# Patient Record
Sex: Female | Born: 1970 | Race: White | Hispanic: No | Marital: Married | Smoking: Former smoker
Health system: Southern US, Community
[De-identification: ages and names within clinical notes are randomized; demographics above are authoritative.]

## PROBLEM LIST (undated history)

## (undated) DIAGNOSIS — F4310 Post-traumatic stress disorder, unspecified: Secondary | ICD-10-CM

## (undated) DIAGNOSIS — F909 Attention-deficit hyperactivity disorder, unspecified type: Secondary | ICD-10-CM

## (undated) DIAGNOSIS — R1084 Generalized abdominal pain: Secondary | ICD-10-CM

## (undated) DIAGNOSIS — K219 Gastro-esophageal reflux disease without esophagitis: Secondary | ICD-10-CM

## (undated) DIAGNOSIS — F419 Anxiety disorder, unspecified: Secondary | ICD-10-CM

## (undated) DIAGNOSIS — G8929 Other chronic pain: Secondary | ICD-10-CM

## (undated) HISTORY — PX: BREAST SURGERY: SHX581

---

## 2008-06-26 ENCOUNTER — Emergency Department (HOSPITAL_BASED_OUTPATIENT_CLINIC_OR_DEPARTMENT_OTHER): Admission: EM | Admit: 2008-06-26 | Discharge: 2008-06-26 | Payer: Self-pay | Admitting: Emergency Medicine

## 2010-07-30 LAB — BASIC METABOLIC PANEL
Calcium: 10.2 mg/dL (ref 8.4–10.5)
GFR calc Af Amer: >60 mL/min (ref >60)
GFR calc non Af Amer: >60 mL/min (ref >60)
Sodium: 145 mEq/L (ref 135–145)

## 2011-05-02 ENCOUNTER — Encounter (HOSPITAL_BASED_OUTPATIENT_CLINIC_OR_DEPARTMENT_OTHER): Payer: Self-pay | Admitting: Emergency Medicine

## 2011-05-02 ENCOUNTER — Emergency Department (HOSPITAL_BASED_OUTPATIENT_CLINIC_OR_DEPARTMENT_OTHER)
Admission: EM | Admit: 2011-05-02 | Discharge: 2011-05-02 | Disposition: A | Discharge status: Home / Self Care | Payer: BC Managed Care – PPO | Attending: Emergency Medicine | Admitting: Emergency Medicine

## 2011-05-02 DIAGNOSIS — S0100XA Unspecified open wound of scalp, initial encounter: Secondary | ICD-10-CM | POA: Insufficient documentation

## 2011-05-02 DIAGNOSIS — IMO0002 Reserved for concepts with insufficient information to code with codable children: Secondary | ICD-10-CM | POA: Insufficient documentation

## 2011-05-02 DIAGNOSIS — S0101XA Laceration without foreign body of scalp, initial encounter: Secondary | ICD-10-CM

## 2011-05-02 DIAGNOSIS — Z79899 Other long term (current) drug therapy: Secondary | ICD-10-CM | POA: Insufficient documentation

## 2011-05-02 MED ORDER — HYDROCODONE-ACETAMINOPHEN 5-325 MG PO TABS
1.0000 | ORAL_TABLET | Freq: Once | ORAL | Status: AC
  Administered 2011-05-02: 1 via ORAL
  Filled 2011-05-02: qty 1

## 2011-05-02 NOTE — ED Notes (Signed)
Pt closing hatch to car and accidentally pulled down on head.  Denies LOC.  Pt has laceration to top of head.  Bleeding controlled.

## 2011-05-02 NOTE — ED Notes (Signed)
D/c home with ride- ice pack given for home use 

## 2011-05-02 NOTE — ED Provider Notes (Signed)
Medical screening examination/treatment/procedure(s) were performed by non-physician practitioner and as supervising physician I was immediately available for consultation/collaboration.   Dava Rensch A Christabella Alvira, MD 05/02/11 2212 

## 2011-05-02 NOTE — ED Provider Notes (Signed)
History     CSN: [id number]  Arrival date & time 05/02/11  7829   First MD Initiated Contact with Patient 05/02/11 1850      Chief Complaint  Patient presents with  . Head Injury  . Head Laceration    (Consider location/radiation/quality/duration/timing/severity/associated sxs/prior treatment) HPI Comments: Pt was closing the hatch of car and she closed it on her head  Patient is a 41 y.o. female presenting with head injury. The history is provided by the patient. No language interpreter was used.  Head Injury  The incident occurred less than 1 hour ago. She came to the ER via walk-in. The injury mechanism was a direct blow. There was no loss of consciousness. The volume of blood lost was minimal. The quality of the pain is described as throbbing. The pain is moderate. The pain has been constant since the injury.    History reviewed. No pertinent past medical history.  Past Surgical History  Procedure Date  . Cesarean section   . Breast surgery     History reviewed. No pertinent family history.  History  Substance Use Topics  . Smoking status: Current Everyday Smoker -- 1.0 packs/day  . Smokeless tobacco: Not on file  . Alcohol Use: 16.8 oz/week    14 Cans of beer, 14 Glasses of wine per week    OB History    Grav Para Term Preterm Abortions TAB SAB Ect Mult Living                  Review of Systems  All other systems reviewed and are negative.    Allergies  Iodine and Shellfish-derived products  Home Medications   Current Outpatient Rx  Name Route Sig Dispense Refill  . ALPRAZOLAM 1 MG PO TABS Oral Take 0.5 mg by mouth 3 (three) times daily as needed. For anxiety    . GOODY HEADACHE PO Oral Take 1 packet by mouth 2 (two) times daily as needed. For headache    . ATOMOXETINE HCL 18 MG PO CAPS Oral Take 18 mg by mouth daily.    . ATOMOXETINE HCL 25 MG PO CAPS Oral Take 25 mg by mouth daily.    Marland Kitchen METHOCARBAMOL PO Oral Take 1 tablet by mouth at bedtime as  needed. For back pain    . TETRAHYDROZOLINE-ZN SULFATE 0.05-0.25 % OP SOLN Both Eyes Place 1 drop into both eyes every morning.    . TRAZODONE HCL 100 MG PO TABS Oral Take 100 mg by mouth at bedtime.      BP 112/72  Pulse 97  Temp(Src) 98 F (36.7 C) (Oral)  Resp 16  SpO2 98%  LMP 04/19/2011  Physical Exam  Nursing note and vitals reviewed. Constitutional: She is oriented to person, place, and time. She appears well-developed and well-nourished.  HENT:       Pt has a laceration to the top of her scalp  Eyes: Conjunctivae and EOM are normal. Pupils are equal, round, and reactive to light.  Neck: Neck supple.  Cardiovascular: Normal rate and regular rhythm.   Pulmonary/Chest: Effort normal and breath sounds normal.  Musculoskeletal: Normal range of motion.  Neurological: She is alert and oriented to person, place, and time.  Skin:       Laceration to the top of the scalp  Psychiatric: She has a normal mood and affect.    ED Course  LACERATION REPAIR Performed by: Teressa Lower Authorized by: Teressa Lower Consent: Verbal consent obtained. Written consent not obtained. Risks  and benefits: risks, benefits and alternatives were discussed Consent given by: patient Patient understanding: patient states understanding of the procedure being performed Patient identity confirmed: verbally with patient Time out: Immediately prior to procedure a "time out" was called to verify the correct patient, procedure, equipment, support staff and site/side marked as required. Body area: head/neck Location details: scalp Laceration length: 2 cm Foreign bodies: no foreign bodies Anesthesia: local infiltration Local anesthetic: lidocaine 2% with epinephrine Irrigation solution: saline Irrigation method: syringe Amount of cleaning: standard Skin closure: staples Approximation: close Approximation difficulty: simple Comments: 4 staples placed without any problem   (including critical  care time)  Labs Reviewed - No data to display No results found.   1. Scalp laceration       MDM  Wound closed without any problem        Teressa Lower, NP 05/02/11 1913

## 2012-09-23 DIAGNOSIS — D649 Anemia, unspecified: Secondary | ICD-10-CM | POA: Insufficient documentation

## 2012-09-23 DIAGNOSIS — F988 Other specified behavioral and emotional disorders with onset usually occurring in childhood and adolescence: Secondary | ICD-10-CM | POA: Insufficient documentation

## 2013-06-12 DIAGNOSIS — F410 Panic disorder [episodic paroxysmal anxiety] without agoraphobia: Secondary | ICD-10-CM | POA: Insufficient documentation

## 2013-10-10 DIAGNOSIS — Z720 Tobacco use: Secondary | ICD-10-CM | POA: Insufficient documentation

## 2014-01-31 ENCOUNTER — Emergency Department (HOSPITAL_BASED_OUTPATIENT_CLINIC_OR_DEPARTMENT_OTHER): Payer: BC Managed Care – PPO

## 2014-01-31 ENCOUNTER — Encounter (HOSPITAL_BASED_OUTPATIENT_CLINIC_OR_DEPARTMENT_OTHER): Payer: Self-pay | Admitting: Emergency Medicine

## 2014-01-31 ENCOUNTER — Emergency Department (HOSPITAL_BASED_OUTPATIENT_CLINIC_OR_DEPARTMENT_OTHER)
Admission: EM | Admit: 2014-01-31 | Discharge: 2014-01-31 | Disposition: A | Discharge status: Home / Self Care | Payer: BC Managed Care – PPO | Attending: Emergency Medicine | Admitting: Emergency Medicine

## 2014-01-31 DIAGNOSIS — N760 Acute vaginitis: Secondary | ICD-10-CM | POA: Insufficient documentation

## 2014-01-31 DIAGNOSIS — Z79899 Other long term (current) drug therapy: Secondary | ICD-10-CM | POA: Insufficient documentation

## 2014-01-31 DIAGNOSIS — R1013 Epigastric pain: Secondary | ICD-10-CM | POA: Diagnosis not present

## 2014-01-31 DIAGNOSIS — R1030 Lower abdominal pain, unspecified: Secondary | ICD-10-CM

## 2014-01-31 DIAGNOSIS — F4310 Post-traumatic stress disorder, unspecified: Secondary | ICD-10-CM | POA: Diagnosis not present

## 2014-01-31 DIAGNOSIS — G8929 Other chronic pain: Secondary | ICD-10-CM | POA: Diagnosis not present

## 2014-01-31 DIAGNOSIS — M549 Dorsalgia, unspecified: Secondary | ICD-10-CM | POA: Diagnosis not present

## 2014-01-31 DIAGNOSIS — K219 Gastro-esophageal reflux disease without esophagitis: Secondary | ICD-10-CM | POA: Diagnosis not present

## 2014-01-31 DIAGNOSIS — R0602 Shortness of breath: Secondary | ICD-10-CM | POA: Insufficient documentation

## 2014-01-31 DIAGNOSIS — F909 Attention-deficit hyperactivity disorder, unspecified type: Secondary | ICD-10-CM | POA: Diagnosis not present

## 2014-01-31 DIAGNOSIS — B9689 Other specified bacterial agents as the cause of diseases classified elsewhere: Secondary | ICD-10-CM

## 2014-01-31 DIAGNOSIS — Z720 Tobacco use: Secondary | ICD-10-CM | POA: Diagnosis not present

## 2014-01-31 DIAGNOSIS — F419 Anxiety disorder, unspecified: Secondary | ICD-10-CM | POA: Diagnosis not present

## 2014-01-31 HISTORY — DX: Gastro-esophageal reflux disease without esophagitis: K21.9

## 2014-01-31 HISTORY — DX: Generalized abdominal pain: R10.84

## 2014-01-31 HISTORY — DX: Other chronic pain: G89.29

## 2014-01-31 HISTORY — DX: Attention-deficit hyperactivity disorder, unspecified type: F90.9

## 2014-01-31 HISTORY — DX: Anxiety disorder, unspecified: F41.9

## 2014-01-31 HISTORY — DX: Post-traumatic stress disorder, unspecified: F43.10

## 2014-01-31 LAB — CBC
HEMATOCRIT: 45.7 % (ref 36.0–46.0)
Hemoglobin: 15.5 g/dL — ABNORMAL HIGH (ref 12.0–15.0)
MCH: 32.2 pg (ref 26.0–34.0)
MCHC: 33.9 g/dL (ref 30.0–36.0)
MCV: 95 fL (ref 78.0–100.0)
PLATELETS: 361 10*3/uL (ref 150–400)
RBC: 4.81 MIL/uL (ref 3.87–5.11)
RDW: 13.1 % (ref 11.5–15.5)
WBC: 9.9 10*3/uL (ref 4.0–10.5)

## 2014-01-31 LAB — WET PREP, GENITAL
Trich, Wet Prep: NONE SEEN
YEAST WET PREP: NONE SEEN

## 2014-01-31 LAB — COMPREHENSIVE METABOLIC PANEL
ALBUMIN: 4.4 g/dL (ref 3.5–5.2)
ALT: 10 U/L (ref 0–35)
ANION GAP: 12 (ref 5–15)
AST: 18 U/L (ref 0–37)
Alkaline Phosphatase: 63 U/L (ref 39–117)
BILIRUBIN TOTAL: 0.5 mg/dL (ref 0.3–1.2)
BUN: 11 mg/dL (ref 6–23)
CALCIUM: 9.9 mg/dL (ref 8.4–10.5)
CO2: 27 mEq/L (ref 19–32)
CREATININE: 0.8 mg/dL (ref 0.50–1.10)
Chloride: 102 mEq/L (ref 96–112)
GFR calc Af Amer: >90 mL/min (ref >90)
GFR calc non Af Amer: 90 mL/min — ABNORMAL LOW (ref >90)
Glucose, Bld: 90 mg/dL (ref 70–99)
Potassium: 4.3 mEq/L (ref 3.7–5.3)
Sodium: 141 mEq/L (ref 137–147)
TOTAL PROTEIN: 7.4 g/dL (ref 6.0–8.3)

## 2014-01-31 LAB — LIPASE, BLOOD: LIPASE: 33 U/L (ref 11–59)

## 2014-01-31 LAB — TROPONIN I

## 2014-01-31 MED ORDER — MORPHINE SULFATE 4 MG/ML IJ SOLN
4.0000 mg | Freq: Once | INTRAMUSCULAR | Status: AC
  Administered 2014-01-31: 4 mg via INTRAVENOUS
  Filled 2014-01-31: qty 1

## 2014-01-31 MED ORDER — METRONIDAZOLE 500 MG PO TABS: 500.0000 mg | ORAL_TABLET | Freq: Two times a day (BID) | ORAL | Status: DC

## 2014-01-31 MED ORDER — LORAZEPAM 2 MG/ML IJ SOLN
1.0000 mg | Freq: Once | INTRAMUSCULAR | Status: AC
  Administered 2014-01-31: 1 mg via INTRAVENOUS
  Filled 2014-01-31: qty 1

## 2014-01-31 MED ORDER — SODIUM CHLORIDE 0.9 % IV BOLUS (SEPSIS)
1000.0000 mL | Freq: Once | INTRAVENOUS | Status: AC
  Administered 2014-01-31: 1000 mL via INTRAVENOUS

## 2014-01-31 MED ORDER — GI COCKTAIL ~~LOC~~
30.0000 mL | Freq: Once | ORAL | Status: AC
  Administered 2014-01-31: 30 mL via ORAL
  Filled 2014-01-31: qty 30

## 2014-01-31 MED ORDER — SUCRALFATE 1 G PO TABS: 1.0000 g | ORAL_TABLET | Freq: Three times a day (TID) | ORAL | Status: DC

## 2014-01-31 NOTE — ED Notes (Signed)
Patient refusing urine sample, MD made aware.

## 2014-01-31 NOTE — Discharge Instructions (Signed)
Abdominal Pain, Women °Abdominal (stomach, pelvic, or belly) pain can be caused by many things. It is important to tell your doctor: °· The location of the pain. °· Does it come and go or is it present all the time? °· Are there things that start the pain (eating certain foods, exercise)? °· Are there other symptoms associated with the pain (fever, nausea, vomiting, diarrhea)? °All of this is helpful to know when trying to find the cause of the pain. °CAUSES  °· Stomach: virus or bacteria infection, or ulcer. °· Intestine: appendicitis (inflamed appendix), regional ileitis (Crohn's disease), ulcerative colitis (inflamed colon), irritable bowel syndrome, diverticulitis (inflamed diverticulum of the colon), or cancer of the stomach or intestine. °· Gallbladder disease or stones in the gallbladder. °· Kidney disease, kidney stones, or infection. °· Pancreas infection or cancer. °· Fibromyalgia (pain disorder). °· Diseases of the female organs: °¨ Uterus: fibroid (non-cancerous) tumors or infection. °¨ Fallopian tubes: infection or tubal pregnancy. °¨ Ovary: cysts or tumors. °¨ Pelvic adhesions (scar tissue). °¨ Endometriosis (uterus lining tissue growing in the pelvis and on the pelvic organs). °¨ Pelvic congestion syndrome (female organs filling up with blood just before the menstrual period). °¨ Pain with the menstrual period. °¨ Pain with ovulation (producing an egg). °¨ Pain with an IUD (intrauterine device, birth control) in the uterus. °¨ Cancer of the female organs. °· Functional pain (pain not caused by a disease, may improve without treatment). °· Psychological pain. °· Depression. °DIAGNOSIS  °Your doctor will decide the seriousness of your pain by doing an examination. °· Blood tests. °· X-rays. °· Ultrasound. °· CT scan (computed tomography, special type of X-ray). °· MRI (magnetic resonance imaging). °· Cultures, for infection. °· Barium enema (dye inserted in the large intestine, to better view it with  X-rays). °· Colonoscopy (looking in intestine with a lighted tube). °· Laparoscopy (minor surgery, looking in abdomen with a lighted tube). °· Major abdominal exploratory surgery (looking in abdomen with a large incision). °TREATMENT  °The treatment will depend on the cause of the pain.  °· Many cases can be observed and treated at home. °· Over-the-counter medicines recommended by your caregiver. °· Prescription medicine. °· Antibiotics, for infection. °· Birth control pills, for painful periods or for ovulation pain. °· Hormone treatment, for endometriosis. °· Nerve blocking injections. °· Physical therapy. °· Antidepressants. °· Counseling with a psychologist or psychiatrist. °· Minor or major surgery. °HOME CARE INSTRUCTIONS  °· Do not take laxatives, unless directed by your caregiver. °· Take over-the-counter pain medicine only if ordered by your caregiver. Do not take aspirin because it can cause an upset stomach or bleeding. °· Try a clear liquid diet (broth or water) as ordered by your caregiver. Slowly move to a bland diet, as tolerated, if the pain is related to the stomach or intestine. °· Have a thermometer and take your temperature several times a day, and record it. °· Bed rest and sleep, if it helps the pain. °· Avoid sexual intercourse, if it causes pain. °· Avoid stressful situations. °· Keep your follow-up appointments and tests, as your caregiver orders. °· If the pain does not go away with medicine or surgery, you may try: °¨ Acupuncture. °¨ Relaxation exercises (yoga, meditation). °¨ Group therapy. °¨ Counseling. °SEEK MEDICAL CARE IF:  °· You notice certain foods cause stomach pain. °· Your home care treatment is not helping your pain. °· You need stronger pain medicine. °· You want your IUD removed. °· You feel faint or   lightheaded. °· You develop nausea and vomiting. °· You develop a rash. °· You are having side effects or an allergy to your medicine. °SEEK IMMEDIATE MEDICAL CARE IF:  °· Your  pain does not go away or gets worse. °· You have a fever. °· Your pain is felt only in portions of the abdomen. The right side could possibly be appendicitis. The left lower portion of the abdomen could be colitis or diverticulitis. °· You are passing blood in your stools (bright red or black tarry stools, with or without vomiting). °· You have blood in your urine. °· You develop chills, with or without a fever. °· You pass out. °MAKE SURE YOU:  °· Understand these instructions. °· Will watch your condition. °· Will get help right away if you are not doing well or get worse. °Document Released: 01/31/2007 Document Revised: 08/20/2013 Document Reviewed: 02/20/2009 °ExitCare® Patient Information ©2015 [employer]. This information is not intended to replace advice given to you by your health care provider. Make sure you discuss any questions you have with your health care provider. ° °

## 2014-01-31 NOTE — ED Notes (Signed)
Patient transported to CT 

## 2014-01-31 NOTE — ED Provider Notes (Signed)
CSN: [id number]     Arrival date & time 01/31/14  4098 History   First MD Initiated Contact with Patient 01/31/14 0755     Chief Complaint  Patient presents with  . Abdominal Pain  . Back Pain     (Consider location/radiation/quality/duration/timing/severity/associated sxs/prior Treatment) HPI Comments: Patient here with epigastric pain. This is chronic. Takes pantoprazole for this. Has had this over 20 years. Described as squeezing, and today's episode is the worst she's ever had. She also states chronic hx of lower abdominal bloating, stating, "for my size, I always look like I'm 3 months pregnant." Today she is also experiencing sharp lower abdominal pains. No vaginal complaints. No urinary complaints. Is being treated for a yeast infection at this time.  She has never experienced these sharp lower abdominal pains before.   Patient is a 43 y.o. female presenting with abdominal pain and back pain. The history is provided by the patient.  Abdominal Pain Pain location:  Epigastric Pain quality: squeezing   Pain radiates to:  Back Pain severity:  Moderate Onset quality:  Gradual Timing:  Constant Relieved by:  Nothing Worsened by:  Nothing tried Associated symptoms: shortness of breath   Associated symptoms: no chills, no cough, no diarrhea, no fever, no nausea and no vomiting   Back Pain Associated symptoms: abdominal pain   Associated symptoms: no fever     Past Medical History  Diagnosis Date  . GERD (gastroesophageal reflux disease)   . ADHD (attention deficit hyperactivity disorder)   . PTSD (post-traumatic stress disorder)   . Anxiety   . Abdominal pain, chronic, generalized    Past Surgical History  Procedure Laterality Date  . Cesarean section    . Breast surgery     No family history on file. History  Substance Use Topics  . Smoking status: Current Every Day Smoker -- 1.00 packs/day  . Smokeless tobacco: Not on file  . Alcohol Use: 16.8 oz/week    14  Glasses of wine, 14 Cans of beer per week   OB History   Grav Para Term Preterm Abortions TAB SAB Ect Mult Living                 Review of Systems  Constitutional: Negative for fever and chills.  Respiratory: Positive for shortness of breath. Negative for cough.   Gastrointestinal: Positive for abdominal pain. Negative for nausea, vomiting and diarrhea.  Musculoskeletal: Positive for back pain.  All other systems reviewed and are negative.     Allergies  Iodine and Shellfish-derived products  Home Medications   Prior to Admission medications   Medication Sig Start Date End Date Taking? Authorizing Provider  amphetamine-dextroamphetamine (ADDERALL) 10 MG tablet Take 10 mg by mouth daily with breakfast.   Yes Historical Provider, MD  dicyclomine (BENTYL) 20 MG tablet Take 20 mg by mouth every 6 (six) hours.   Yes Historical Provider, MD  lurasidone (LATUDA) 40 MG TABS tablet Take 40 mg by mouth daily with breakfast.   Yes Historical Provider, MD  pantoprazole (PROTONIX) 40 MG tablet Take 40 mg by mouth daily.   Yes Historical Provider, MD  ALPRAZolam Prudy Feeler) 1 MG tablet Take 0.5 mg by mouth 3 (three) times daily as needed. For anxiety    Historical Provider, MD  traZODone (DESYREL) 100 MG tablet Take 100 mg by mouth at bedtime.    Historical Provider, MD   BP 125/91  Pulse 74  Temp(Src) 97.9 F (36.6 C) (Oral)  Resp 16  SpO2  97% Physical Exam  Nursing note and vitals reviewed. Constitutional: She is oriented to person, place, and time. She appears well-developed and well-nourished. No distress.  HENT:  Head: Normocephalic and atraumatic.  Mouth/Throat: Oropharynx is clear and moist.  Eyes: EOM are normal. Pupils are equal, round, and reactive to light.  Neck: Normal range of motion. Neck supple.  Cardiovascular: Normal rate and regular rhythm.  Exam reveals no friction rub.   No murmur heard. Pulmonary/Chest: Effort normal and breath sounds normal. No respiratory  distress. She has no wheezes. She has no rales.  Abdominal: Soft. She exhibits no distension. There is tenderness (lower abdomen, diffuse, epigastric). There is no rebound.  Musculoskeletal: Normal range of motion. She exhibits no edema.  Neurological: She is alert and oriented to person, place, and time. No cranial nerve deficit. She exhibits normal muscle tone. Coordination normal.  Skin: No rash noted. She is not diaphoretic.    ED Course  Procedures (including critical care time) Labs Review Labs Reviewed  CBC  COMPREHENSIVE METABOLIC PANEL  LIPASE, BLOOD  URINALYSIS, ROUTINE W REFLEX MICROSCOPIC  TROPONIN I    Imaging Review Ct Abdomen Pelvis Wo Contrast  01/31/2014   CLINICAL DATA:  Bloating, lower abdominal pain, iodine allergy  EXAM: CT ABDOMEN AND PELVIS WITHOUT CONTRAST  TECHNIQUE: Multidetector CT imaging of the abdomen and pelvis was performed following the standard protocol without IV contrast.  COMPARISON:  08/29/2012.  FINDINGS: Lung bases are unremarkable. Sagittal images of the spine shows degenerative changes lower thoracic spine. Unenhanced liver shows no biliary ductal dilatation. No calcified gallstones are noted within gallbladder.  Unenhanced pancreas, spleen and adrenal glands are unremarkable. Unenhanced kidneys are symmetrical in size. There is nonobstructive punctate calculus in midpole of the left kidney measures 2.4 mm. No hydronephrosis or hydroureter.  No calcified ureteral calculi are noted. No aortic aneurysm. No small bowel obstruction. No pericecal inflammation. The terminal ileum is unremarkable. Normal appendix clearly visualized coronal image 32.  The unenhanced uterus and adnexa are unremarkable. The urinary bladder is unremarkable.  No inguinal adenopathy. No destructive bony lesions are noted within pelvis.  IMPRESSION: 1. Left nonobstructive nephrolithiasis. No hydronephrosis or hydroureter. 2. No calcified ureteral calculi. 3. No small bowel  obstruction. 4. Normal appendix.  No pericecal inflammation. 5. No adnexal mass on this unenhanced scan.   Electronically Signed   By: Natasha MeadLiviu  Pop M.D.   On: 01/31/2014 08:54   Dg Chest 2 View  01/31/2014   CLINICAL DATA:  Chest pain and shortness of breath. Abdominal pain and bloating. Back pain.  EXAM: CHEST  2 VIEW  COMPARISON:  None.  FINDINGS: The heart size and mediastinal contours are within normal limits. Both lungs are clear. The visualized skeletal structures are unremarkable.  IMPRESSION: Normal exam.   Electronically Signed   By: Geanie CooleyJim  Maxwell M.D.   On: 01/31/2014 08:44     EKG Interpretation   Date/Time:  Thursday January 31 2014 08:27:02 EDT Ventricular Rate:  86 PR Interval:  130 QRS Duration: 76 QT Interval:  374 QTC Calculation: 447 R Axis:   82 Text Interpretation:  Normal sinus rhythm with sinus arrhythmia Cannot  rule out Anterior infarct , age undetermined Abnormal ECG No prior EKG for  comparison Confirmed by [facility] LP[facility]ALDEN  MD, Tyquarius Paglia ([id number]) on 01/31/2014 8:44:53 AM      MDM   Final diagnoses:  Epigastric pain    45F here with abdominal pain at epigastrum and diffuse lower abdominal pain.   Epigastric pain chronic,  today's episode is the worst she's ever had in the 20+ years she's been having this pain. Normally this is controlled with a PPI. Will check EKG, labs.  She's also having lower abdominal pain, which is new. Will do pelvic and do a CT scan. I suspect this is c/w with her hx of bloating and other chronic abdominal pains.   CT scan normal. Labs normal. Patient feeling better. She inquired about GI follow-up, which I agree with for her chronic abdominal issues. Carafate given. Wet prep showed clue cells, will treat with flagyl.  Elwin MochaBlair Zyrell Carmean, MD 01/31/14 1011

## 2014-01-31 NOTE — ED Notes (Signed)
Pt states she is having bloating but has worsened and is having diffuse pain radiating to the back.

## 2014-02-01 LAB — GC/CHLAMYDIA PROBE AMP
CT Probe RNA: NEGATIVE
GC PROBE AMP APTIMA: NEGATIVE

## 2014-05-15 ENCOUNTER — Encounter (HOSPITAL_BASED_OUTPATIENT_CLINIC_OR_DEPARTMENT_OTHER): Payer: Self-pay

## 2014-05-15 DIAGNOSIS — Y9389 Activity, other specified: Secondary | ICD-10-CM | POA: Insufficient documentation

## 2014-05-15 DIAGNOSIS — Y9289 Other specified places as the place of occurrence of the external cause: Secondary | ICD-10-CM | POA: Diagnosis not present

## 2014-05-15 DIAGNOSIS — W500XXA Accidental hit or strike by another person, initial encounter: Secondary | ICD-10-CM | POA: Insufficient documentation

## 2014-05-15 DIAGNOSIS — F419 Anxiety disorder, unspecified: Secondary | ICD-10-CM | POA: Diagnosis not present

## 2014-05-15 DIAGNOSIS — Y998 Other external cause status: Secondary | ICD-10-CM | POA: Diagnosis not present

## 2014-05-15 DIAGNOSIS — Z79899 Other long term (current) drug therapy: Secondary | ICD-10-CM | POA: Diagnosis not present

## 2014-05-15 DIAGNOSIS — S0181XA Laceration without foreign body of other part of head, initial encounter: Secondary | ICD-10-CM | POA: Diagnosis not present

## 2014-05-15 DIAGNOSIS — F909 Attention-deficit hyperactivity disorder, unspecified type: Secondary | ICD-10-CM | POA: Diagnosis not present

## 2014-05-15 DIAGNOSIS — G8929 Other chronic pain: Secondary | ICD-10-CM | POA: Diagnosis not present

## 2014-05-15 DIAGNOSIS — Z792 Long term (current) use of antibiotics: Secondary | ICD-10-CM | POA: Insufficient documentation

## 2014-05-15 DIAGNOSIS — K219 Gastro-esophageal reflux disease without esophagitis: Secondary | ICD-10-CM | POA: Diagnosis not present

## 2014-05-15 DIAGNOSIS — Z720 Tobacco use: Secondary | ICD-10-CM | POA: Insufficient documentation

## 2014-05-15 NOTE — ED Notes (Signed)
Pt and adult female were walking out ED WR door-asked if they were leaving-adult female stated they were stepping outside to make a phone call-pt with cell phone to ear as she walked out door-NAD

## 2014-05-15 NOTE — ED Notes (Signed)
Pt states tripped and hit head on a furnace, lac above lt eye, bleeding control

## 2014-05-16 ENCOUNTER — Emergency Department (HOSPITAL_BASED_OUTPATIENT_CLINIC_OR_DEPARTMENT_OTHER)
Admission: EM | Admit: 2014-05-16 | Discharge: 2014-05-16 | Disposition: A | Discharge status: Home / Self Care | Payer: BLUE CROSS/BLUE SHIELD | Attending: Emergency Medicine | Admitting: Emergency Medicine

## 2014-05-16 DIAGNOSIS — S0181XA Laceration without foreign body of other part of head, initial encounter: Secondary | ICD-10-CM

## 2014-05-16 MED ORDER — LIDOCAINE-EPINEPHRINE 2 %-1:100000 IJ SOLN
20.0000 mL | Freq: Once | INTRAMUSCULAR | Status: DC
  Filled 2014-05-16: qty 1

## 2014-05-16 NOTE — Discharge Instructions (Signed)
Facial Laceration  A facial laceration is a cut on the face. These injuries can be painful and cause bleeding. Lacerations usually heal quickly, but they need special care to reduce scarring. DIAGNOSIS  Your health care provider will take a medical history, ask for details about how the injury occurred, and examine the wound to determine how deep the cut is. TREATMENT  Some facial lacerations may not require closure. Others may not be able to be closed because of an increased risk of infection. The risk of infection and the chance for successful closure will depend on various factors, including the amount of time since the injury occurred. The wound may be cleaned to help prevent infection. If closure is appropriate, pain medicines may be given if needed. Your health care provider will use stitches (sutures), wound glue (adhesive), or skin adhesive strips to repair the laceration. These tools bring the skin edges together to allow for faster healing and a better cosmetic outcome. If needed, you may also be given a tetanus shot. HOME CARE INSTRUCTIONS  Only take over-the-counter or prescription medicines as directed by your health care provider.  Follow your health care provider's instructions for wound care. These instructions will vary depending on the technique used for closing the wound. For Sutures:  Keep the wound clean and dry.   If you were given a bandage (dressing), you should change it at least once a day. Also change the dressing if it becomes wet or dirty, or as directed by your health care provider.   Wash the wound with soap and water 2 times a day. Rinse the wound off with water to remove all soap. Pat the wound dry with a clean towel.   After cleaning, apply a thin layer of the antibiotic ointment recommended by your health care provider. This will help prevent infection and keep the dressing from sticking.   You may shower as usual after the first 24 hours. Do not soak the  wound in water until the sutures are removed.   Get your sutures removed as directed by your health care provider. With facial lacerations, sutures should usually be taken out after 4-5 days to avoid stitch marks.   Wait a few days after your sutures are removed before applying any makeup. For Skin Adhesive Strips:  Keep the wound clean and dry.   Do not get the skin adhesive strips wet. You may bathe carefully, using caution to keep the wound dry.   If the wound gets wet, pat it dry with a clean towel.   Skin adhesive strips will fall off on their own. You may trim the strips as the wound heals. Do not remove skin adhesive strips that are still stuck to the wound. They will fall off in time.  For Wound Adhesive:  You may briefly wet your wound in the shower or bath. Do not soak or scrub the wound. Do not swim. Avoid periods of heavy sweating until the skin adhesive has fallen off on its own. After showering or bathing, gently pat the wound dry with a clean towel.   Do not apply liquid medicine, cream medicine, ointment medicine, or makeup to your wound while the skin adhesive is in place. This may loosen the film before your wound is healed.   If a dressing is placed over the wound, be careful not to apply tape directly over the skin adhesive. This may cause the adhesive to be pulled off before the wound is healed.   Avoid   prolonged exposure to sunlight or tanning lamps while the skin adhesive is in place.  The skin adhesive will usually remain in place for 5-10 days, then naturally fall off the skin. Do not pick at the adhesive film.  After Healing: Once the wound has healed, cover the wound with sunscreen during the day for 1 full year. This can help minimize scarring. Exposure to ultraviolet light in the first year will darken the scar. It can take 1-2 years for the scar to lose its redness and to heal completely.  SEEK IMMEDIATE MEDICAL CARE IF:  You have redness, pain, or  swelling around the wound.   You see ayellowish-white fluid (pus) coming from the wound.   You have chills or a fever.  MAKE SURE YOU:  Understand these instructions.  Will watch your condition.  Will get help right away if you are not doing well or get worse. Document Released: 05/13/2004 Document Revised: 01/24/2013 Document Reviewed: 11/16/2012 [employer] Patient Information 2015 [employer]. This information is not intended to replace advice given to you by your health care provider. Make sure you discuss any questions you have with your health care provider.  

## 2014-05-16 NOTE — ED Notes (Signed)
Tripped and hit forehead on furnace, 1.5 inch lac over left eye  Bleeding controlled  Denies loc
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
## 2014-06-20 DIAGNOSIS — K219 Gastro-esophageal reflux disease without esophagitis: Secondary | ICD-10-CM | POA: Insufficient documentation

## 2014-06-20 DIAGNOSIS — R5383 Other fatigue: Secondary | ICD-10-CM | POA: Insufficient documentation

## 2014-06-20 DIAGNOSIS — G4700 Insomnia, unspecified: Secondary | ICD-10-CM | POA: Insufficient documentation

## 2015-01-10 ENCOUNTER — Emergency Department (HOSPITAL_BASED_OUTPATIENT_CLINIC_OR_DEPARTMENT_OTHER)
Admission: EM | Admit: 2015-01-10 | Discharge: 2015-01-10 | Disposition: A | Discharge status: Home / Self Care | Payer: BLUE CROSS/BLUE SHIELD | Attending: Emergency Medicine | Admitting: Emergency Medicine

## 2015-01-10 ENCOUNTER — Emergency Department (HOSPITAL_BASED_OUTPATIENT_CLINIC_OR_DEPARTMENT_OTHER): Payer: BLUE CROSS/BLUE SHIELD

## 2015-01-10 ENCOUNTER — Encounter (HOSPITAL_BASED_OUTPATIENT_CLINIC_OR_DEPARTMENT_OTHER): Payer: Self-pay | Admitting: *Deleted

## 2015-01-10 DIAGNOSIS — S60811A Abrasion of right wrist, initial encounter: Secondary | ICD-10-CM | POA: Diagnosis not present

## 2015-01-10 DIAGNOSIS — S63501A Unspecified sprain of right wrist, initial encounter: Secondary | ICD-10-CM | POA: Diagnosis not present

## 2015-01-10 DIAGNOSIS — F419 Anxiety disorder, unspecified: Secondary | ICD-10-CM | POA: Diagnosis not present

## 2015-01-10 DIAGNOSIS — Y998 Other external cause status: Secondary | ICD-10-CM | POA: Diagnosis not present

## 2015-01-10 DIAGNOSIS — S6991XA Unspecified injury of right wrist, hand and finger(s), initial encounter: Secondary | ICD-10-CM | POA: Diagnosis present

## 2015-01-10 DIAGNOSIS — Y92410 Unspecified street and highway as the place of occurrence of the external cause: Secondary | ICD-10-CM | POA: Insufficient documentation

## 2015-01-10 DIAGNOSIS — Z720 Tobacco use: Secondary | ICD-10-CM | POA: Diagnosis not present

## 2015-01-10 DIAGNOSIS — S0083XA Contusion of other part of head, initial encounter: Secondary | ICD-10-CM | POA: Diagnosis not present

## 2015-01-10 DIAGNOSIS — Y9389 Activity, other specified: Secondary | ICD-10-CM | POA: Diagnosis not present

## 2015-01-10 DIAGNOSIS — Z79899 Other long term (current) drug therapy: Secondary | ICD-10-CM | POA: Diagnosis not present

## 2015-01-10 DIAGNOSIS — G8929 Other chronic pain: Secondary | ICD-10-CM | POA: Diagnosis not present

## 2015-01-10 DIAGNOSIS — K219 Gastro-esophageal reflux disease without esophagitis: Secondary | ICD-10-CM | POA: Diagnosis not present

## 2015-01-10 DIAGNOSIS — F909 Attention-deficit hyperactivity disorder, unspecified type: Secondary | ICD-10-CM | POA: Diagnosis not present

## 2015-01-10 DIAGNOSIS — T07XXXA Unspecified multiple injuries, initial encounter: Secondary | ICD-10-CM

## 2015-01-10 MED ORDER — HYDROCODONE-ACETAMINOPHEN 5-325 MG PO TABS: ORAL_TABLET | ORAL | Status: DC
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
## 2015-01-10 NOTE — Discharge Instructions (Signed)
For pain control please take ibuprofen (also known as Motrin or Advil)  (this is normally 4 over the counter pills) 3 times a day  for 5 days. Take with food to minimize stomach irritation.  Rest, Ice intermittently (in the first 24-48 hours), Gentle compression with an Ace wrap, and elevate (Limb above the level of the heart)   Take up to  of ibuprofen (that is usually 4 over the counter pills)  3 times a day for 5 days. Take with food.  You were diagnosed with a right wrist sprain, a fracture of the scaphoid bone cannot be ruled out at this time. For this reason, please wear your splint at all times,  cover with a plastic bag when you bathe. You will need a repeat x-ray in 7-10 days to be sure this bone is not broken. Please follow with your primary doctor, or the orthopedist you have been referred to, or, you may return to the emergency room for the repeat x-ray.   Wash the affected area with soap and water and apply a thin layer of topical antibiotic ointment. Do this every 12 hours.   Do not use rubbing alcohol or hydrogen peroxide.                        Look for signs of infection: if you see redness, if the area becomes warm, if pain increases sharply, there is discharge (pus), if red streaks appear or you develop fever or vomiting, RETURN immediately to the Emergency Department  for a recheck.

## 2015-02-13 ENCOUNTER — Ambulatory Visit (INDEPENDENT_AMBULATORY_CARE_PROVIDER_SITE_OTHER): Payer: BLUE CROSS/BLUE SHIELD | Admitting: Family Medicine

## 2015-02-13 ENCOUNTER — Ambulatory Visit (HOSPITAL_BASED_OUTPATIENT_CLINIC_OR_DEPARTMENT_OTHER)
Admission: RE | Admit: 2015-02-13 | Discharge: 2015-02-13 | Disposition: A | Discharge status: Home / Self Care | Payer: BLUE CROSS/BLUE SHIELD | Source: Ambulatory Visit | Attending: Family Medicine | Admitting: Family Medicine

## 2015-02-13 ENCOUNTER — Encounter (INDEPENDENT_AMBULATORY_CARE_PROVIDER_SITE_OTHER): Payer: Self-pay

## 2015-02-13 ENCOUNTER — Encounter: Payer: Self-pay | Admitting: Family Medicine

## 2015-02-13 VITALS — BP 140/100 | HR 90 | Ht 62.0 in | Wt 120.0 lb

## 2015-02-13 DIAGNOSIS — S6991XA Unspecified injury of right wrist, hand and finger(s), initial encounter: Secondary | ICD-10-CM

## 2015-02-13 DIAGNOSIS — S79911A Unspecified injury of right hip, initial encounter: Secondary | ICD-10-CM

## 2015-02-13 DIAGNOSIS — M25551 Pain in right hip: Secondary | ICD-10-CM | POA: Diagnosis not present

## 2015-02-13 MED ORDER — DICLOFENAC SODIUM 75 MG PO TBEC
75.0000 mg | DELAYED_RELEASE_TABLET | Freq: Two times a day (BID) | ORAL | Status: DC
Start: 2015-02-13 — End: 2016-02-03
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
## 2015-02-19 DIAGNOSIS — S6991XA Unspecified injury of right wrist, hand and finger(s), initial encounter: Secondary | ICD-10-CM | POA: Insufficient documentation

## 2015-02-19 DIAGNOSIS — M25551 Pain in right hip: Secondary | ICD-10-CM | POA: Insufficient documentation
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
## 2015-03-17 DIAGNOSIS — K580 Irritable bowel syndrome with diarrhea: Secondary | ICD-10-CM | POA: Insufficient documentation

## 2015-03-19 ENCOUNTER — Encounter: Payer: Self-pay | Admitting: Family Medicine

## 2015-03-19 ENCOUNTER — Ambulatory Visit (INDEPENDENT_AMBULATORY_CARE_PROVIDER_SITE_OTHER): Payer: BLUE CROSS/BLUE SHIELD | Admitting: Family Medicine

## 2015-03-19 VITALS — BP 118/87 | HR 88 | Ht 62.0 in | Wt 125.0 lb

## 2015-03-19 DIAGNOSIS — M545 Low back pain, unspecified: Secondary | ICD-10-CM

## 2015-03-19 DIAGNOSIS — S6991XD Unspecified injury of right wrist, hand and finger(s), subsequent encounter: Secondary | ICD-10-CM

## 2015-03-19 DIAGNOSIS — G5601 Carpal tunnel syndrome, right upper limb: Secondary | ICD-10-CM | POA: Diagnosis not present

## 2015-03-19 NOTE — Patient Instructions (Signed)
Your low back pain is likely musculoskeletal (combination on muscle, ligament, disc bulging). Tylenol for baseline pain relief (1-2 extra strength tabs 3x/day) Aleve 2 tabs twice a day OR ibuprofen 600mg  three times a day with food as needed for pain and inflammation. Steroid dose pack is a consideration if this flares up badly. Muscle relaxants as needed for spasms. Stay as active as possible. Physical therapy has been shown to be helpful as well. Strengthening of low back muscles, abdominal musculature are key for long term pain relief. Do home exercises daily for next 6 weeks. If not improving, will consider physical therapy, further imaging (x-rays, MRI).  You have carpal tunnel syndrome. Wear the wrist brace at nighttime and as often as possible during the day Ibuprofen 600mg  three times a day with food OR aleve 2 tabs twice a day with food for pain and inflammation. A prednisone dose pack is a consideration. Corticosteroid injection is a consideration to help with pain and inflammation If not improving, will consider nerve conduction studies to assess severity. Follow up with me in 5-6 weeks.

## 2015-03-20 DIAGNOSIS — M545 Low back pain, unspecified: Secondary | ICD-10-CM | POA: Insufficient documentation

## 2015-03-20 DIAGNOSIS — G5601 Carpal tunnel syndrome, right upper limb: Secondary | ICD-10-CM | POA: Insufficient documentation

## 2015-03-20 NOTE — Assessment & Plan Note (Signed)
2/2 sprain, extensor tendon strain.  Much improved.
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
## 2015-03-20 NOTE — Assessment & Plan Note (Signed)
Tylenol, nsaids, shown home exercises to do daily.  Consider prednisone, physical therapy, muscle relaxants, imaging if not improving.  F/u in 5-6 weeks.

## 2015-03-20 NOTE — Assessment & Plan Note (Signed)
continue brace at nighttime and as often as possible during the day.  NSAIDs as needed.  Consider prednisone, injection, nerve conduction studies if not improving.  F/u in 5-6 weeks.

## 2015-04-23 ENCOUNTER — Ambulatory Visit (INDEPENDENT_AMBULATORY_CARE_PROVIDER_SITE_OTHER): Payer: BLUE CROSS/BLUE SHIELD | Admitting: Family Medicine

## 2015-04-23 ENCOUNTER — Encounter (INDEPENDENT_AMBULATORY_CARE_PROVIDER_SITE_OTHER): Payer: Self-pay

## 2015-04-23 ENCOUNTER — Encounter: Payer: Self-pay | Admitting: Family Medicine

## 2015-04-23 VITALS — BP 114/82 | HR 88 | Ht 60.0 in | Wt 120.0 lb

## 2015-04-23 DIAGNOSIS — M545 Low back pain, unspecified: Secondary | ICD-10-CM

## 2015-04-23 DIAGNOSIS — G5601 Carpal tunnel syndrome, right upper limb: Secondary | ICD-10-CM

## 2015-04-23 DIAGNOSIS — S6991XD Unspecified injury of right wrist, hand and finger(s), subsequent encounter: Secondary | ICD-10-CM | POA: Diagnosis not present

## 2015-04-23 DIAGNOSIS — M25551 Pain in right hip: Secondary | ICD-10-CM | POA: Diagnosis not present

## 2015-04-23 NOTE — Patient Instructions (Signed)
Your carpal tunnel syndrome has improved. 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 You can do hip stretching, the hacky sack strengthening exercise I showed you but time is the most likely thing to help this (along with avoiding direct compression to the area). Ibuprofen or aleve if needed. Icing 15 minutes at a time 3-4 times a day.  For the arthritis of your fingers you can consider the following: Tylenol 500mg  1-2 tabs three times a day for pain. Aleve 1-2 tabs twice a day with food Glucosamine sulfate 750mg  twice a day is a supplement that may help. Capsaicin, aspercreme, or biofreeze topically up to four times a day may also help with pain.  Follow up with me in 2-3 months for reevaluation.

## 2015-04-25 NOTE — Assessment & Plan Note (Signed)
2/2 sprain, extensor tendon strain.  Much improved.     

## 2015-04-25 NOTE — Assessment & Plan Note (Signed)
Improving with home exercises.  Tylenol, nsaids, continue home exercises daily.  Consider prednisone, physical therapy, muscle relaxants, imaging if not improving.  F/u in 2-3 months.

## 2015-04-25 NOTE — Assessment & Plan Note (Signed)
radiographs negative, reassuring last visit.  Consistent with hip pointer - advised not much she can do to accelerate healing process from this.  Tylenol, nsaids, shown strengthening and stretching exercises for hip and sartorius.

## 2015-04-25 NOTE — Assessment & Plan Note (Signed)
resolved at this point.  Wrist brace only if needed.
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
## 2015-07-22 ENCOUNTER — Ambulatory Visit: Payer: BLUE CROSS/BLUE SHIELD | Admitting: Family Medicine

## 2015-08-04 DIAGNOSIS — Z9889 Other specified postprocedural states: Secondary | ICD-10-CM | POA: Insufficient documentation

## 2015-08-04 DIAGNOSIS — N857 Hematometra: Secondary | ICD-10-CM | POA: Insufficient documentation

## 2015-08-04 DIAGNOSIS — Z9851 Tubal ligation status: Secondary | ICD-10-CM | POA: Insufficient documentation

## 2016-01-26 DIAGNOSIS — F330 Major depressive disorder, recurrent, mild: Secondary | ICD-10-CM | POA: Insufficient documentation

## 2016-01-29 ENCOUNTER — Ambulatory Visit (INDEPENDENT_AMBULATORY_CARE_PROVIDER_SITE_OTHER): Payer: BLUE CROSS/BLUE SHIELD | Admitting: Family Medicine

## 2016-01-29 ENCOUNTER — Other Ambulatory Visit (HOSPITAL_BASED_OUTPATIENT_CLINIC_OR_DEPARTMENT_OTHER): Payer: Self-pay | Admitting: Family Medicine

## 2016-01-29 ENCOUNTER — Ambulatory Visit (HOSPITAL_BASED_OUTPATIENT_CLINIC_OR_DEPARTMENT_OTHER)
Admission: RE | Admit: 2016-01-29 | Discharge: 2016-01-29 | Disposition: A | Discharge status: Home / Self Care | Payer: BLUE CROSS/BLUE SHIELD | Source: Ambulatory Visit | Attending: Family Medicine | Admitting: Family Medicine

## 2016-01-29 VITALS — BP 118/85 | HR 80 | Ht 61.0 in | Wt 127.0 lb

## 2016-01-29 DIAGNOSIS — Z1231 Encounter for screening mammogram for malignant neoplasm of breast: Secondary | ICD-10-CM | POA: Insufficient documentation

## 2016-01-29 DIAGNOSIS — Z9882 Breast implant status: Secondary | ICD-10-CM | POA: Diagnosis not present

## 2016-01-29 DIAGNOSIS — S59902A Unspecified injury of left elbow, initial encounter: Secondary | ICD-10-CM | POA: Diagnosis not present

## 2016-01-29 DIAGNOSIS — M25522 Pain in left elbow: Secondary | ICD-10-CM | POA: Insufficient documentation

## 2016-01-29 NOTE — Patient Instructions (Signed)
You have an elbow contusion and traumatic insertional triceps tendinopathy. Do tricep kickbacks and overhead extensions 3 sets of 10 once a day. Start with 1-2 pound weight, work your way up to 5 pounds. Heat or ice, whichever feels better, 15 minutes at a time 3-4 times a day. Ibuprofen or aleve only if needed. Consider nitro patches - call me if you want to try this or formal physical therapy. Follow up with me in 6 weeks or as needed.

## 2016-01-30 ENCOUNTER — Encounter: Payer: Self-pay | Admitting: Family Medicine

## 2016-02-03 DIAGNOSIS — S59902A Unspecified injury of left elbow, initial encounter: Secondary | ICD-10-CM | POA: Insufficient documentation

## 2016-02-03 DIAGNOSIS — Z90710 Acquired absence of both cervix and uterus: Secondary | ICD-10-CM | POA: Insufficient documentation

## 2016-02-03 NOTE — Assessment & Plan Note (Signed)
independently reviewed radiographs and no evidence fracture.  Consistent with elbow contusion and traumatic insertional triceps tendinopathy.  Shown home exercises to do daily.  Heat/ice.  Ibuprofen only if needed.  Consider physical therapy and/or nitro patches.  F/u in 6 weeks or prn.
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
## 2016-02-13 ENCOUNTER — Telehealth: Payer: Self-pay | Admitting: Family Medicine

## 2016-02-13 MED ORDER — NITROGLYCERIN 0.2 MG/HR TD PT24: MEDICATED_PATCH | TRANSDERMAL | 1 refills | Status: DC

## 2016-02-13 NOTE — Telephone Encounter (Signed)
Ok sent this in - please notify patient.  Thanks!

## 2016-10-16 ENCOUNTER — Emergency Department (HOSPITAL_BASED_OUTPATIENT_CLINIC_OR_DEPARTMENT_OTHER): Payer: BLUE CROSS/BLUE SHIELD

## 2016-10-16 ENCOUNTER — Inpatient Hospital Stay (HOSPITAL_BASED_OUTPATIENT_CLINIC_OR_DEPARTMENT_OTHER)
Admission: EM | Admit: 2016-10-16 | Discharge: 2016-10-18 | DRG: 872 | Disposition: A | Discharge status: Home / Self Care | Payer: BLUE CROSS/BLUE SHIELD | Attending: Internal Medicine | Admitting: Internal Medicine

## 2016-10-16 ENCOUNTER — Encounter (HOSPITAL_BASED_OUTPATIENT_CLINIC_OR_DEPARTMENT_OTHER): Payer: Self-pay | Admitting: *Deleted

## 2016-10-16 DIAGNOSIS — E876 Hypokalemia: Secondary | ICD-10-CM | POA: Diagnosis present

## 2016-10-16 DIAGNOSIS — K219 Gastro-esophageal reflux disease without esophagitis: Secondary | ICD-10-CM | POA: Diagnosis present

## 2016-10-16 DIAGNOSIS — F330 Major depressive disorder, recurrent, mild: Secondary | ICD-10-CM | POA: Diagnosis present

## 2016-10-16 DIAGNOSIS — E872 Acidosis: Secondary | ICD-10-CM | POA: Diagnosis present

## 2016-10-16 DIAGNOSIS — R1084 Generalized abdominal pain: Secondary | ICD-10-CM | POA: Diagnosis present

## 2016-10-16 DIAGNOSIS — Z789 Other specified health status: Secondary | ICD-10-CM | POA: Diagnosis present

## 2016-10-16 DIAGNOSIS — R739 Hyperglycemia, unspecified: Secondary | ICD-10-CM | POA: Diagnosis present

## 2016-10-16 DIAGNOSIS — J209 Acute bronchitis, unspecified: Secondary | ICD-10-CM | POA: Diagnosis present

## 2016-10-16 DIAGNOSIS — Z90710 Acquired absence of both cervix and uterus: Secondary | ICD-10-CM

## 2016-10-16 DIAGNOSIS — M25551 Pain in right hip: Secondary | ICD-10-CM

## 2016-10-16 DIAGNOSIS — Z91013 Allergy to seafood: Secondary | ICD-10-CM

## 2016-10-16 DIAGNOSIS — A419 Sepsis, unspecified organism: Principal | ICD-10-CM | POA: Diagnosis present

## 2016-10-16 DIAGNOSIS — Z720 Tobacco use: Secondary | ICD-10-CM | POA: Diagnosis present

## 2016-10-16 DIAGNOSIS — F329 Major depressive disorder, single episode, unspecified: Secondary | ICD-10-CM | POA: Diagnosis present

## 2016-10-16 DIAGNOSIS — Z7289 Other problems related to lifestyle: Secondary | ICD-10-CM | POA: Diagnosis present

## 2016-10-16 DIAGNOSIS — F1090 Alcohol use, unspecified, uncomplicated: Secondary | ICD-10-CM | POA: Diagnosis present

## 2016-10-16 DIAGNOSIS — F909 Attention-deficit hyperactivity disorder, unspecified type: Secondary | ICD-10-CM | POA: Diagnosis present

## 2016-10-16 DIAGNOSIS — F4310 Post-traumatic stress disorder, unspecified: Secondary | ICD-10-CM | POA: Diagnosis present

## 2016-10-16 DIAGNOSIS — F17210 Nicotine dependence, cigarettes, uncomplicated: Secondary | ICD-10-CM | POA: Diagnosis present

## 2016-10-16 DIAGNOSIS — Z823 Family history of stroke: Secondary | ICD-10-CM

## 2016-10-16 DIAGNOSIS — R0602 Shortness of breath: Secondary | ICD-10-CM | POA: Diagnosis not present

## 2016-10-16 DIAGNOSIS — G8929 Other chronic pain: Secondary | ICD-10-CM | POA: Diagnosis present

## 2016-10-16 DIAGNOSIS — Z888 Allergy status to other drugs, medicaments and biological substances status: Secondary | ICD-10-CM

## 2016-10-16 LAB — CBC WITH DIFFERENTIAL/PLATELET
Basophils Absolute: 0 10*3/uL (ref 0.0–0.1)
Basophils Relative: 0 %
EOS ABS: 0.1 10*3/uL (ref 0.0–0.7)
EOS PCT: 1 %
HCT: 39.3 % (ref 36.0–46.0)
HEMOGLOBIN: 13.6 g/dL (ref 12.0–15.0)
LYMPHS ABS: 1.8 10*3/uL (ref 0.7–4.0)
Lymphocytes Relative: 12 %
MCH: 32.5 pg (ref 26.0–34.0)
MCHC: 34.6 g/dL (ref 30.0–36.0)
MCV: 94 fL (ref 78.0–100.0)
MONOS PCT: 6 %
Monocytes Absolute: 1 10*3/uL (ref 0.1–1.0)
NEUTROS PCT: 81 %
Neutro Abs: 12.5 10*3/uL — ABNORMAL HIGH (ref 1.7–7.7)
Platelets: 255 10*3/uL (ref 150–400)
RBC: 4.18 MIL/uL (ref 3.87–5.11)
RDW: 13.4 % (ref 11.5–15.5)
WBC: 15.4 10*3/uL — ABNORMAL HIGH (ref 4.0–10.5)

## 2016-10-16 LAB — COMPREHENSIVE METABOLIC PANEL
ALT: 15 U/L (ref 14–54)
AST: 21 U/L (ref 15–41)
Albumin: 3.8 g/dL (ref 3.5–5.0)
Alkaline Phosphatase: 69 U/L (ref 38–126)
Anion gap: 12 (ref 5–15)
CHLORIDE: 101 mmol/L (ref 101–111)
CO2: 23 mmol/L (ref 22–32)
Calcium: 8.7 mg/dL — ABNORMAL LOW (ref 8.9–10.3)
Creatinine, Ser: 0.62 mg/dL (ref 0.44–1.00)
GFR calc Af Amer: >60 mL/min (ref >60)
GFR calc non Af Amer: >60 mL/min (ref >60)
Glucose, Bld: 198 mg/dL — ABNORMAL HIGH (ref 65–99)
Potassium: 2.8 mmol/L — ABNORMAL LOW (ref 3.5–5.1)
SODIUM: 136 mmol/L (ref 135–145)
Total Bilirubin: 0.9 mg/dL (ref 0.3–1.2)
Total Protein: 6.9 g/dL (ref 6.5–8.1)

## 2016-10-16 LAB — TROPONIN I

## 2016-10-16 LAB — D-DIMER, QUANTITATIVE: D-Dimer, Quant: 0.35 ug/mL-FEU (ref 0.00–0.50)

## 2016-10-16 MED ORDER — DOXYCYCLINE HYCLATE 100 MG PO TABS
100.0000 mg | ORAL_TABLET | Freq: Once | ORAL | Status: AC
  Administered 2016-10-16: 100 mg via ORAL
  Filled 2016-10-16: qty 1

## 2016-10-16 MED ORDER — ALBUTEROL SULFATE (2.5 MG/3ML) 0.083% IN NEBU
2.5000 mg | INHALATION_SOLUTION | Freq: Once | RESPIRATORY_TRACT | Status: AC
  Administered 2016-10-16: 2.5 mg via RESPIRATORY_TRACT
  Filled 2016-10-16: qty 3

## 2016-10-16 MED ORDER — METHYLPREDNISOLONE SODIUM SUCC 125 MG IJ SOLR
125.0000 mg | Freq: Once | INTRAMUSCULAR | Status: AC
  Administered 2016-10-16: 125 mg via INTRAVENOUS
  Filled 2016-10-16: qty 2

## 2016-10-16 MED ORDER — IPRATROPIUM-ALBUTEROL 0.5-2.5 (3) MG/3ML IN SOLN
3.0000 mL | Freq: Once | RESPIRATORY_TRACT | Status: AC
  Administered 2016-10-16: 3 mL via RESPIRATORY_TRACT
  Filled 2016-10-16: qty 3

## 2016-10-16 MED ORDER — SODIUM CHLORIDE 0.9 % IV BOLUS (SEPSIS)
500.0000 mL | Freq: Once | INTRAVENOUS | Status: AC
  Administered 2016-10-16: 500 mL via INTRAVENOUS

## 2016-10-16 MED ORDER — LORAZEPAM 2 MG/ML IJ SOLN
1.0000 mg | Freq: Once | INTRAMUSCULAR | Status: AC
  Administered 2016-10-17: 1 mg via INTRAVENOUS
  Filled 2016-10-16: qty 1

## 2016-10-16 MED ORDER — POTASSIUM CHLORIDE CRYS ER 20 MEQ PO TBCR
40.0000 meq | EXTENDED_RELEASE_TABLET | Freq: Once | ORAL | Status: AC
  Administered 2016-10-16: 40 meq via ORAL
  Filled 2016-10-16: qty 2

## 2016-10-16 NOTE — ED Provider Notes (Signed)
[facility] Provider Note   CSN: [id number] Arrival date & time: 10/16/16  2108  By signing my name below, I, Thelma Barge, attest that this documentation has been prepared under the direction and in the presence of Lorretta Harp, MD. Electronically Signed: Thelma Barge, Scribe. 10/16/16. 10:07 PM.  History   Chief Complaint Chief Complaint  Patient presents with  . Cough   The history is provided by the patient. No language interpreter was used.    HPI Comments: Stacie Young is a 46 y.o. female who presents to the Emergency Department complaining of constant, gradually worsening productive cough with bright yellow/green sputum for 3 days. She has associated sore throat, SOB, CP, and chills. She notes she wakes up at night due to the pain. She had associated wheezing, but after receiving an albuterol breathing treatment tonight, she felt better. She denies fever, abdominal pain, vomiting, rash, and leg swelling or pain.  Pt has no pertinent medical problems or PMHx of asthma or COPD. Pt takes xanax and occasional amphetamines for work. Pt is a smoker and drinks alcohol 2 wine/night with no withdrawal symptoms. She denies use of street drugs.  Past Medical History:  Diagnosis Date  . Abdominal pain, chronic, generalized   . ADHD (attention deficit hyperactivity disorder)   . Anxiety   . GERD (gastroesophageal reflux disease)   . PTSD (post-traumatic stress disorder)     Patient Active Problem List   Diagnosis Date Noted  . Bronchitis 10/17/2016  . H/O: hysterectomy 02/03/2016  . Injury of left elbow 02/03/2016  . 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 History of tubal ligation 08/04/2015  . History of endometrial ablation 08/04/2015  . Hematometra 08/04/2015  . Carpal tunnel syndrome, right 03/20/2015  . Low back pain 03/20/2015  . Irritable bowel syndrome with diarrhea 03/17/2015  . Right wrist injury 02/19/2015  . Right hip pain  02/19/2015  . Cannot sleep 06/20/2014  . Gastro-esophageal reflux disease without esophagitis 06/20/2014  . Fatigue 06/20/2014  . Current tobacco use 10/10/2013  . Episodic paroxysmal anxiety disorder 06/12/2013  . Absolute anemia 09/23/2012  . ADD (attention deficit disorder) 09/23/2012    Past Surgical History:  Procedure Laterality Date  . BREAST SURGERY    . CESAREAN SECTION      OB History    No data available       Home Medications    Prior to Admission medications   Medication Sig Start Date End Date Taking? Authorizing Provider  albuterol (PROAIR HFA) 108 (90 BASE) MCG/ACT inhaler Inhale into the lungs. 10/10/13   [provider]  ALPRAZolam Prudy Feeler) 1 MG tablet Take 0.5 mg by mouth 3 (three) times daily as needed. For anxiety    [provider]  amphetamine-dextroamphetamine (ADDERALL) 10 MG tablet Take 10 mg by mouth daily with breakfast.    [provider]  dicyclomine (BENTYL) 20 MG tablet Take 20 mg by mouth every 6 (six) hours.    [provider]  DULoxetine (CYMBALTA) 30 MG capsule Take 1 a day for a week then 2 a day 10/17/14   [provider]  FLUoxetine (PROZAC) 10 MG capsule  01/26/16   [provider]  ketorolac (TORADOL) 10 MG tablet  12/28/15   [provider]  LATUDA 20 MG TABS tablet  01/26/16   [provider]  nitroGLYCERIN (NITRODUR - DOSED IN MG/24 HR) 0.2 mg/hr patch Apply 1/4th patch to affected elbow, change daily 02/13/16   Hudnall, Vincenza Hews  R, MD  pantoprazole (PROTONIX) 40 MG tablet Take 40 mg by mouth daily.    [provider]  temazepam (RESTORIL) 30 MG capsule Take 1 at night for sleep 01/06/15   [provider]    Family History No family history on file.  Social History Social History  Substance Use Topics  . Smoking status: Current Every Day Smoker    Packs/day: 1.00    Types: Cigarettes  . Smokeless tobacco: Never Used  . Alcohol use 16.8 oz/week     14 Glasses of wine, 14 Cans of beer per week     Allergies   Glucosamine; Iodine; and Shellfish-derived products   Review of Systems Review of Systems  Constitutional: Positive for chills. Negative for fever.  HENT: Positive for sore throat.   Respiratory: Positive for cough and shortness of breath.   Cardiovascular: Positive for chest pain. Negative for leg swelling.  Gastrointestinal: Negative for abdominal pain and vomiting.  Musculoskeletal: Negative for myalgias.  Skin: Negative for rash.  All other systems reviewed and are negative.    Physical Exam Updated Vital Signs BP 101/71   Pulse (!) 120   Temp 100.2 F (37.9 C) (Oral) Comment: EDP notified.  Resp (!) 26   LMP 04/19/2011   SpO2 97%   Physical Exam  Constitutional: She is oriented to person, place, and time. She appears well-developed and well-nourished.  uncomfortable appearing  HENT:  Head: Normocephalic and atraumatic.  Cardiovascular: Regular rhythm.   No murmur heard. Tachycardic  Pulmonary/Chest: Effort normal and breath sounds normal. No respiratory distress.  Slightly decreased air movement in the bases bilaterally  Abdominal: Soft. There is no tenderness. There is no rebound and no guarding.  Genitourinary:  Genitourinary Comments:    Musculoskeletal: She exhibits no edema or tenderness.  Neurological: She is alert and oriented to person, place, and time.  Skin: Skin is warm and dry.  Psychiatric: She has a normal mood and affect. Her behavior is normal.  Nursing note and vitals reviewed.    ED Treatments / Results  DIAGNOSTIC STUDIES: Oxygen Saturation is 98% on RA, normal by my interpretation.    COORDINATION OF CARE: 9:45 PM Discussed treatment plan with pt at bedside and pt agreed to plan.  Labs (all labs ordered are listed, but only abnormal results are displayed) Labs Reviewed  COMPREHENSIVE METABOLIC PANEL - Abnormal; Notable for the following:       Result Value    Potassium 2.8 (*)    Glucose, Bld 198 (*)    BUN <5 (*)    Calcium 8.7 (*)    All other components within normal limits  CBC WITH DIFFERENTIAL/PLATELET - Abnormal; Notable for the following:    WBC 15.4 (*)    Neutro Abs 12.5 (*)    All other components within normal limits  MAGNESIUM - Abnormal; Notable for the following:    Magnesium 1.6 (*)    All other components within normal limits  TROPONIN I  D-DIMER, QUANTITATIVE (NOT AT [facility])    EKG  EKG Interpretation  Date/Time:  Saturday October 16 2016 21:54:45 EDT Ventricular Rate:  127 PR Interval:    QRS Duration: 85 QT Interval:  330 QTC Calculation: 480 R Axis:   79 Text Interpretation:  Sinus tachycardia Confirmed by Lincoln Brigham [phone]) on 10/16/2016 10:32:35 PM       Radiology Dg Chest 2 View  Result Date: 10/16/2016 CLINICAL DATA:  46 y/o  F; CP/sob. EXAM: CHEST  2 VIEW COMPARISON:  01/21/2014 chest radiograph FINDINGS: Stable heart size and mediastinal contours are within normal limits. Both lungs are clear. The visualized skeletal structures are unremarkable. IMPRESSION: No active cardiopulmonary disease. Electronically Signed   By: Mitzi HansenLance  Furusawa-Stratton M.D.   On: 10/16/2016 22:40    Procedures Procedures (including critical care time)  Medications Ordered in ED Medications  LORazepam (ATIVAN) injection 1 mg (not administered)  magnesium sulfate IVPB 2 g 50 mL (not administered)  sodium chloride 0.9 % bolus 1,000 mL (not administered)  0.9 %  sodium chloride infusion (not administered)  acetaminophen (TYLENOL) tablet 650 mg (not administered)  ipratropium-albuterol (DUONEB) 0.5-2.5 (3) MG/3ML nebulizer solution 3 mL (3 mLs Nebulization Given 10/16/16 2123)  albuterol (PROVENTIL) (2.5 MG/3ML) 0.083% nebulizer solution 2.5 mg (2.5 mg Nebulization Given 10/16/16 2123)  sodium chloride 0.9 % bolus 500 mL (0 mLs Intravenous Stopped 10/16/16 2244)  albuterol (PROVENTIL) (2.5 MG/3ML) 0.083% nebulizer solution 2.5 mg (2.5  mg Nebulization Given 10/16/16 2206)  potassium chloride SA (K-DUR,KLOR-CON) CR tablet 40 mEq (40 mEq Oral Given 10/16/16 2300)  sodium chloride 0.9 % bolus 500 mL (500 mLs Intravenous New Bag/Given 10/16/16 2335)  doxycycline (VIBRA-TABS) tablet 100 mg (100 mg Oral Given 10/16/16 2333)  methylPREDNISolone sodium succinate (SOLU-MEDROL) 125 mg/2 mL injection 125 mg (125 mg Intravenous Given 10/16/16 2333)     Initial Impression / Assessment and Plan / ED Course  I have reviewed the triage vital signs and the nursing notes.  Pertinent labs & imaging results that were available during my care of the patient were reviewed by me and considered in my medical decision making (see chart for details).     Patient here for evaluation of cough, shortness of breath, chest pain. Initial examination with decreased air movement. No significant change following albuterol treatments 2. Chest x-ray with no evidence of acute infiltrate treated with antibiotics for potential developing infection. BMP demonstrates hypokalemia, will replace. Patient with persistent tachycardia in the emergency department despite IV fluid hydration. Plan to admit for observation.  Patient updated findings and studies and recommendation for admission and she is in agreement with plan.  Final Clinical Impressions(s) / ED Diagnoses   Final diagnoses:  Acute bronchitis, unspecified organism    New Prescriptions New Prescriptions   No medications on file  I personally performed the services described in this documentation, which was scribed in my presence. The recorded information has been reviewed and is accurate.    Tilden Fossaees, Deandre Brannan, MD 10/17/16 [phone]

## 2016-10-16 NOTE — ED Triage Notes (Signed)
Pt states about 3 days ago she started having a sore throat and having a cough/sob. Denies fevers.

## 2016-10-17 ENCOUNTER — Encounter (HOSPITAL_BASED_OUTPATIENT_CLINIC_OR_DEPARTMENT_OTHER): Payer: Self-pay | Admitting: Internal Medicine

## 2016-10-17 DIAGNOSIS — Z720 Tobacco use: Secondary | ICD-10-CM

## 2016-10-17 DIAGNOSIS — K219 Gastro-esophageal reflux disease without esophagitis: Secondary | ICD-10-CM | POA: Diagnosis present

## 2016-10-17 DIAGNOSIS — R739 Hyperglycemia, unspecified: Secondary | ICD-10-CM | POA: Diagnosis present

## 2016-10-17 DIAGNOSIS — F330 Major depressive disorder, recurrent, mild: Secondary | ICD-10-CM | POA: Diagnosis not present

## 2016-10-17 DIAGNOSIS — G8929 Other chronic pain: Secondary | ICD-10-CM | POA: Diagnosis present

## 2016-10-17 DIAGNOSIS — Z823 Family history of stroke: Secondary | ICD-10-CM | POA: Diagnosis not present

## 2016-10-17 DIAGNOSIS — A419 Sepsis, unspecified organism: Secondary | ICD-10-CM | POA: Diagnosis present

## 2016-10-17 DIAGNOSIS — Z888 Allergy status to other drugs, medicaments and biological substances status: Secondary | ICD-10-CM | POA: Diagnosis not present

## 2016-10-17 DIAGNOSIS — F909 Attention-deficit hyperactivity disorder, unspecified type: Secondary | ICD-10-CM | POA: Diagnosis present

## 2016-10-17 DIAGNOSIS — Z789 Other specified health status: Secondary | ICD-10-CM | POA: Diagnosis present

## 2016-10-17 DIAGNOSIS — E876 Hypokalemia: Secondary | ICD-10-CM | POA: Diagnosis present

## 2016-10-17 DIAGNOSIS — R1084 Generalized abdominal pain: Secondary | ICD-10-CM | POA: Diagnosis present

## 2016-10-17 DIAGNOSIS — J209 Acute bronchitis, unspecified: Secondary | ICD-10-CM | POA: Diagnosis not present

## 2016-10-17 DIAGNOSIS — F17210 Nicotine dependence, cigarettes, uncomplicated: Secondary | ICD-10-CM | POA: Diagnosis present

## 2016-10-17 DIAGNOSIS — Z91013 Allergy to seafood: Secondary | ICD-10-CM | POA: Diagnosis not present

## 2016-10-17 DIAGNOSIS — R0602 Shortness of breath: Secondary | ICD-10-CM | POA: Diagnosis present

## 2016-10-17 DIAGNOSIS — Z90710 Acquired absence of both cervix and uterus: Secondary | ICD-10-CM | POA: Diagnosis not present

## 2016-10-17 DIAGNOSIS — E872 Acidosis: Secondary | ICD-10-CM | POA: Diagnosis present

## 2016-10-17 DIAGNOSIS — F329 Major depressive disorder, single episode, unspecified: Secondary | ICD-10-CM | POA: Diagnosis present

## 2016-10-17 DIAGNOSIS — F4310 Post-traumatic stress disorder, unspecified: Secondary | ICD-10-CM | POA: Diagnosis present

## 2016-10-17 DIAGNOSIS — Z7289 Other problems related to lifestyle: Secondary | ICD-10-CM | POA: Diagnosis present

## 2016-10-17 LAB — RESPIRATORY PANEL BY PCR
ADENOVIRUS-RVPPCR: NOT DETECTED
Bordetella pertussis: NOT DETECTED
CHLAMYDOPHILA PNEUMONIAE-RVPPCR: NOT DETECTED
CORONAVIRUS NL63-RVPPCR: NOT DETECTED
CORONAVIRUS OC43-RVPPCR: NOT DETECTED
Coronavirus 229E: NOT DETECTED
Coronavirus HKU1: NOT DETECTED
INFLUENZA A-RVPPCR: NOT DETECTED
INFLUENZA B-RVPPCR: NOT DETECTED
Metapneumovirus: NOT DETECTED
Mycoplasma pneumoniae: NOT DETECTED
PARAINFLUENZA VIRUS 1-RVPPCR: NOT DETECTED
PARAINFLUENZA VIRUS 3-RVPPCR: NOT DETECTED
PARAINFLUENZA VIRUS 4-RVPPCR: NOT DETECTED
Parainfluenza Virus 2: NOT DETECTED
RESPIRATORY SYNCYTIAL VIRUS-RVPPCR: NOT DETECTED
RHINOVIRUS / ENTEROVIRUS - RVPPCR: NOT DETECTED

## 2016-10-17 LAB — BASIC METABOLIC PANEL
Anion gap: 8 (ref 5–15)
CALCIUM: 7.7 mg/dL — AB (ref 8.9–10.3)
CO2: 19 mmol/L — AB (ref 22–32)
Chloride: 113 mmol/L — ABNORMAL HIGH (ref 101–111)
Creatinine, Ser: 0.59 mg/dL (ref 0.44–1.00)
GFR calc non Af Amer: >60 mL/min (ref >60)
GLUCOSE: 151 mg/dL — AB (ref 65–99)
Potassium: 3.9 mmol/L (ref 3.5–5.1)
Sodium: 140 mmol/L (ref 135–145)

## 2016-10-17 LAB — STREP PNEUMONIAE URINARY ANTIGEN: Strep Pneumo Urinary Antigen: NEGATIVE

## 2016-10-17 LAB — PROCALCITONIN: Procalcitonin: <0.1 ng/mL

## 2016-10-17 LAB — LACTIC ACID, PLASMA
LACTIC ACID, VENOUS: 3.6 mmol/L — AB (ref 0.5–1.9)
Lactic Acid, Venous: 3.7 mmol/L (ref 0.5–1.9)
Lactic Acid, Venous: 2.2 mmol/L (ref 0.5–1.9)

## 2016-10-17 LAB — RAPID STREP SCREEN (MED CTR MEBANE ONLY): Streptococcus, Group A Screen (Direct): NEGATIVE

## 2016-10-17 LAB — HCG, QUANTITATIVE, PREGNANCY: HCG, BETA CHAIN, QUANT, S: 1 m[IU]/mL (ref <5)

## 2016-10-17 LAB — MAGNESIUM: Magnesium: 1.6 mg/dL — ABNORMAL LOW (ref 1.7–2.4)

## 2016-10-17 LAB — HIV ANTIBODY (ROUTINE TESTING W REFLEX): HIV SCREEN 4TH GENERATION: NONREACTIVE

## 2016-10-17 MED ORDER — VITAMIN B-1 100 MG PO TABS
100.0000 mg | ORAL_TABLET | Freq: Every day | ORAL | Status: DC
  Administered 2016-10-17 – 2016-10-18 (×2): 100 mg via ORAL
  Filled 2016-10-17 (×2): qty 1

## 2016-10-17 MED ORDER — SODIUM CHLORIDE 0.9 % IV BOLUS (SEPSIS)
1000.0000 mL | Freq: Once | INTRAVENOUS | Status: AC
  Administered 2016-10-17: 1000 mL via INTRAVENOUS

## 2016-10-17 MED ORDER — IPRATROPIUM BROMIDE 0.02 % IN SOLN
0.5000 mg | Freq: Three times a day (TID) | RESPIRATORY_TRACT | Status: DC
  Administered 2016-10-17 (×2): 0.5 mg via RESPIRATORY_TRACT
  Filled 2016-10-17 (×2): qty 2.5

## 2016-10-17 MED ORDER — LORAZEPAM 2 MG/ML IJ SOLN
0.0000 mg | Freq: Four times a day (QID) | INTRAMUSCULAR | Status: DC
  Administered 2016-10-17 (×2): 1 mg via INTRAVENOUS

## 2016-10-17 MED ORDER — METHYLPREDNISOLONE SODIUM SUCC 125 MG IJ SOLR: 60.0000 mg | Freq: Two times a day (BID) | INTRAMUSCULAR | Status: DC

## 2016-10-17 MED ORDER — ACETAMINOPHEN 325 MG PO TABS: 650.0000 mg | ORAL_TABLET | Freq: Four times a day (QID) | ORAL | Status: DC | PRN

## 2016-10-17 MED ORDER — DULOXETINE HCL 30 MG PO CPEP
30.0000 mg | ORAL_CAPSULE | Freq: Every day | ORAL | Status: DC
  Administered 2016-10-17 – 2016-10-18 (×2): 30 mg via ORAL
  Filled 2016-10-17 (×2): qty 1

## 2016-10-17 MED ORDER — LEVALBUTEROL HCL 1.25 MG/0.5ML IN NEBU
1.2500 mg | INHALATION_SOLUTION | Freq: Two times a day (BID) | RESPIRATORY_TRACT | Status: DC
  Administered 2016-10-17 – 2016-10-18 (×2): 1.25 mg via RESPIRATORY_TRACT
  Filled 2016-10-17 (×3): qty 0.5

## 2016-10-17 MED ORDER — ENOXAPARIN SODIUM 40 MG/0.4ML ~~LOC~~ SOLN: 40.0000 mg | SUBCUTANEOUS | Status: DC

## 2016-10-17 MED ORDER — ALPRAZOLAM 0.5 MG PO TABS: 0.5000 mg | ORAL_TABLET | Freq: Three times a day (TID) | ORAL | Status: DC | PRN

## 2016-10-17 MED ORDER — DICYCLOMINE HCL 20 MG PO TABS
20.0000 mg | ORAL_TABLET | Freq: Four times a day (QID) | ORAL | Status: DC
  Administered 2016-10-17 (×2): 20 mg via ORAL
  Filled 2016-10-17 (×4): qty 1

## 2016-10-17 MED ORDER — ACETAMINOPHEN 325 MG PO TABS
650.0000 mg | ORAL_TABLET | Freq: Once | ORAL | Status: AC
  Administered 2016-10-17: 650 mg via ORAL
  Filled 2016-10-17: qty 2

## 2016-10-17 MED ORDER — LEVALBUTEROL HCL 1.25 MG/0.5ML IN NEBU
1.2500 mg | INHALATION_SOLUTION | Freq: Four times a day (QID) | RESPIRATORY_TRACT | Status: DC
  Administered 2016-10-17: 1.25 mg via RESPIRATORY_TRACT
  Filled 2016-10-17: qty 0.5

## 2016-10-17 MED ORDER — POTASSIUM CHLORIDE CRYS ER 20 MEQ PO TBCR
40.0000 meq | EXTENDED_RELEASE_TABLET | Freq: Once | ORAL | Status: AC
  Administered 2016-10-17: 40 meq via ORAL
  Filled 2016-10-17: qty 2

## 2016-10-17 MED ORDER — SODIUM CHLORIDE 0.9 % IV SOLN
INTRAVENOUS | Status: DC
  Administered 2016-10-17 (×2): via INTRAVENOUS

## 2016-10-17 MED ORDER — NICOTINE 21 MG/24HR TD PT24
21.0000 mg | MEDICATED_PATCH | Freq: Every day | TRANSDERMAL | Status: DC
  Administered 2016-10-17 – 2016-10-18 (×2): 21 mg via TRANSDERMAL
  Filled 2016-10-17 (×2): qty 1

## 2016-10-17 MED ORDER — LEVALBUTEROL HCL 0.63 MG/3ML IN NEBU: 0.6300 mg | INHALATION_SOLUTION | Freq: Four times a day (QID) | RESPIRATORY_TRACT | Status: DC | PRN

## 2016-10-17 MED ORDER — LURASIDONE HCL 20 MG PO TABS
20.0000 mg | ORAL_TABLET | Freq: Every day | ORAL | Status: DC
  Administered 2016-10-18: 20 mg via ORAL
  Filled 2016-10-17 (×2): qty 1

## 2016-10-17 MED ORDER — SODIUM CHLORIDE 0.9 % IV BOLUS (SEPSIS): 500.0000 mL | Freq: Once | INTRAVENOUS | Status: DC

## 2016-10-17 MED ORDER — TEMAZEPAM 15 MG PO CAPS: 30.0000 mg | ORAL_CAPSULE | Freq: Every evening | ORAL | Status: DC | PRN

## 2016-10-17 MED ORDER — DEXTROSE 5 % IV SOLN
1.0000 g | INTRAVENOUS | Status: DC
  Administered 2016-10-17 – 2016-10-18 (×2): 1 g via INTRAVENOUS
  Filled 2016-10-17 (×2): qty 10

## 2016-10-17 MED ORDER — DOXYCYCLINE HYCLATE 100 MG PO TABS
100.0000 mg | ORAL_TABLET | Freq: Two times a day (BID) | ORAL | Status: DC
  Administered 2016-10-17 – 2016-10-18 (×3): 100 mg via ORAL
  Filled 2016-10-17 (×3): qty 1

## 2016-10-17 MED ORDER — ADULT MULTIVITAMIN W/MINERALS CH
1.0000 | ORAL_TABLET | Freq: Every day | ORAL | Status: DC
  Administered 2016-10-17 – 2016-10-18 (×2): 1 via ORAL
  Filled 2016-10-17 (×2): qty 1

## 2016-10-17 MED ORDER — SODIUM CHLORIDE 0.9 % IV BOLUS (SEPSIS)
500.0000 mL | Freq: Once | INTRAVENOUS | Status: AC
  Administered 2016-10-17: 500 mL via INTRAVENOUS

## 2016-10-17 MED ORDER — IPRATROPIUM BROMIDE 0.02 % IN SOLN: 0.5000 mg | RESPIRATORY_TRACT | Status: DC

## 2016-10-17 MED ORDER — FLUOXETINE HCL 10 MG PO CAPS
10.0000 mg | ORAL_CAPSULE | Freq: Every day | ORAL | Status: DC
  Administered 2016-10-18: 10 mg via ORAL
  Filled 2016-10-17 (×2): qty 1

## 2016-10-17 MED ORDER — OXYCODONE-ACETAMINOPHEN 5-325 MG PO TABS
1.0000 | ORAL_TABLET | ORAL | Status: DC | PRN
  Administered 2016-10-18: 1 via ORAL
  Filled 2016-10-17: qty 1

## 2016-10-17 MED ORDER — IPRATROPIUM BROMIDE 0.02 % IN SOLN
0.5000 mg | Freq: Four times a day (QID) | RESPIRATORY_TRACT | Status: DC
  Administered 2016-10-17: 0.5 mg via RESPIRATORY_TRACT
  Filled 2016-10-17: qty 2.5

## 2016-10-17 MED ORDER — LORAZEPAM 2 MG/ML IJ SOLN: 0.0000 mg | Freq: Two times a day (BID) | INTRAMUSCULAR | Status: DC

## 2016-10-17 MED ORDER — LORAZEPAM 2 MG/ML IJ SOLN
1.0000 mg | Freq: Four times a day (QID) | INTRAMUSCULAR | Status: DC | PRN
  Administered 2016-10-18: 1 mg via INTRAVENOUS
  Filled 2016-10-17 (×3): qty 1

## 2016-10-17 MED ORDER — PANTOPRAZOLE SODIUM 40 MG PO TBEC
40.0000 mg | DELAYED_RELEASE_TABLET | Freq: Every day | ORAL | Status: DC
  Administered 2016-10-17 – 2016-10-18 (×2): 40 mg via ORAL
  Filled 2016-10-17 (×2): qty 1

## 2016-10-17 MED ORDER — GUAIFENESIN 100 MG/5ML PO SOLN: 5.0000 mL | ORAL | Status: DC | PRN

## 2016-10-17 MED ORDER — FOLIC ACID 1 MG PO TABS
1.0000 mg | ORAL_TABLET | Freq: Every day | ORAL | Status: DC
  Administered 2016-10-17 – 2016-10-18 (×2): 1 mg via ORAL
  Filled 2016-10-17 (×2): qty 1

## 2016-10-17 MED ORDER — LEVALBUTEROL HCL 1.25 MG/0.5ML IN NEBU
1.2500 mg | INHALATION_SOLUTION | Freq: Three times a day (TID) | RESPIRATORY_TRACT | Status: DC
  Administered 2016-10-17 (×2): 1.25 mg via RESPIRATORY_TRACT
  Filled 2016-10-17 (×2): qty 0.5

## 2016-10-17 MED ORDER — LORAZEPAM 1 MG PO TABS: 1.0000 mg | ORAL_TABLET | Freq: Four times a day (QID) | ORAL | Status: DC | PRN

## 2016-10-17 MED ORDER — DM-GUAIFENESIN ER 30-600 MG PO TB12
1.0000 | ORAL_TABLET | Freq: Two times a day (BID) | ORAL | Status: DC
  Administered 2016-10-17: 1 via ORAL
  Filled 2016-10-17: qty 1

## 2016-10-17 MED ORDER — THIAMINE HCL 100 MG/ML IJ SOLN: 100.0000 mg | Freq: Every day | INTRAMUSCULAR | Status: DC

## 2016-10-17 MED ORDER — PHENOL 1.4 % MT LIQD
1.0000 | OROMUCOSAL | Status: DC | PRN
Start: 2016-10-17 — End: 2016-10-18

## 2016-10-17 MED ORDER — IPRATROPIUM BROMIDE 0.02 % IN SOLN
0.5000 mg | Freq: Two times a day (BID) | RESPIRATORY_TRACT | Status: DC
  Administered 2016-10-17 – 2016-10-18 (×2): 0.5 mg via RESPIRATORY_TRACT
  Filled 2016-10-17 (×2): qty 2.5

## 2016-10-17 MED ORDER — GUAIFENESIN ER 600 MG PO TB12
600.0000 mg | ORAL_TABLET | Freq: Two times a day (BID) | ORAL | Status: DC
  Administered 2016-10-17 – 2016-10-18 (×3): 600 mg via ORAL
  Filled 2016-10-17 (×3): qty 1

## 2016-10-17 MED ORDER — MAGNESIUM SULFATE 2 GM/50ML IV SOLN
2.0000 g | Freq: Once | INTRAVENOUS | Status: AC
  Administered 2016-10-17: 2 g via INTRAVENOUS
  Filled 2016-10-17: qty 50

## 2016-10-17 MED ORDER — AMPHETAMINE-DEXTROAMPHETAMINE 10 MG PO TABS
10.0000 mg | ORAL_TABLET | Freq: Every day | ORAL | Status: DC
  Filled 2016-10-17: qty 1

## 2016-10-17 NOTE — H&P (Signed)
History and Physical    Stacie Young VWU:[id number] DOB: May 20, 1970 DOA: 10/16/2016  Referring MD/NP/PA:   PCP: Angelica Chessman, MD   Patient coming from:  The patient is coming from home.  At baseline, pt is independent for most of ADL.   Chief Complaint: Cough, shortness of breath, chest pain, sore throat  HPI: Stacie Young is a 46 y.o. female with medical history significant of PTSD, ADHD, GERD, depression, anxiety, IBS and chronic abdominal pain, tobacco abuse, alcohol use, who presents with cough, shortness breath, chest pain and sore throat.  Patient states that she began to have cough, shortness breath, chest pain and sore throat 4 days ago, which has been progressively getting worse. She coughs up brownish colored sputum. Her chest pain is located in the frontal chest, constant, 7 out of 10 in severity, sharp, nonradiating, pleuritic, aggravated by coughing and deep breath. Denies tenderness in the calf area. Patient denies subjective fever, but her temperature is 100.2 in ED. She has chills. Patient reports sore throat and runny nose. No nausea, vomiting, diarrhea, abdominal pain, symptoms of UTI or unilateral weakness. She has oxygen desaturation to 89% on room air, required new oxygen supply. Patient states that that she drinks 2 beers each day, sometimes one glass of wine per day,  ED Course: pt was found to have WBC 15.4, negative troponin, negative d-dimer, potassium 2.8, Mg 1.6, creatinine normal, temperature 100.2, tachycardia, tachypnea, chest x-ray is negative for infiltration. Patient is placed on telemetry bed for observation.  Review of Systems:   General: has fevers, chills, no changes in body weight, has poor appetite, has fatigue HEENT: no blurry vision, hearing changes. Has sore throat Respiratory: has dyspnea, coughing, wheezing CV: has chest pain, no palpitations GI: no nausea, vomiting, abdominal pain, diarrhea, constipation GU: no dysuria, burning on  urination, increased urinary frequency, hematuria  Ext: no leg edema Neuro: no unilateral weakness, numbness, or tingling, no vision change or hearing loss Skin: no rash, no skin tear. MSK: No muscle spasm, no deformity, no limitation of range of movement in spin Heme: No easy bruising.  Travel history: No recent long distant travel.  Allergy:  Allergies  Allergen Reactions  . Glucosamine Hives  . Iodine Hives  . Shellfish-Derived Products Hives    Past Medical History:  Diagnosis Date  . Abdominal pain, chronic, generalized   . ADHD (attention deficit hyperactivity disorder)   . Anxiety   . GERD (gastroesophageal reflux disease)   . PTSD (post-traumatic stress disorder)     Past Surgical History:  Procedure Laterality Date  . BREAST SURGERY    . CESAREAN SECTION      Social History:  reports that she has been smoking Cigarettes.  She has been smoking about 1.00 pack per day. She has never used smokeless tobacco. She reports that she drinks about 16.8 oz of alcohol per week . She reports that she does not use drugs.  Family History:  Family History  Problem Relation Age of Onset  . Stroke Father      Prior to Admission medications   Medication Sig Start Date End Date Taking? Authorizing Provider  albuterol (PROAIR HFA) 108 (90 BASE) MCG/ACT inhaler Inhale into the lungs. 10/10/13   [provider]  ALPRAZolam Prudy Feeler) 1 MG tablet Take 0.5 mg by mouth 3 (three) times daily as needed. For anxiety    [provider]  amphetamine-dextroamphetamine (ADDERALL) 10 MG tablet Take 10 mg by mouth daily with breakfast.    [provider]  dicyclomine (BENTYL) 20 MG tablet Take 20 mg by mouth every 6 (six) hours.    [provider]  DULoxetine (CYMBALTA) 30 MG capsule Take 1 a day for a week then 2 a day 10/17/14   [provider]  FLUoxetine (PROZAC) 10 MG capsule  01/26/16   [provider]  ketorolac (TORADOL) 10 MG tablet   12/28/15   [provider]  LATUDA 20 MG TABS tablet  01/26/16   [provider]  nitroGLYCERIN (NITRODUR - DOSED IN MG/24 HR) 0.2 mg/hr patch Apply 1/4th patch to affected elbow, change daily 02/13/16   Hudnall, Azucena FallenShane R, MD  pantoprazole (PROTONIX) 40 MG tablet Take 40 mg by mouth daily.    [provider]  temazepam (RESTORIL) 30 MG capsule Take 1 at night for sleep 01/06/15   [provider]    Physical Exam: Vitals:   10/17/16 0100 10/17/16 0115 10/17/16 0117 10/17/16 0209  BP: 106/72 106/77  101/68  Pulse: (!) 107 (!) 105 (!) 106 (!) 102  Resp:  19 18 16   Temp:    98.6 F (37 C)  TempSrc:    Oral  SpO2: 93% 92% 100% 94%  Weight:    60.7 kg (133 lb 12.8 oz)  Height:    5' (1.524 m)   General: Not in acute distress HEENT:       Eyes: PERRL, EOMI, no scleral icterus.       ENT: No discharge from the ears and nose, has pharynx injection, no tonsillar enlargement.        Neck: No JVD, no bruit, no mass felt. Heme: No neck lymph node enlargement. Cardiac: S1/S2, RRR, No murmurs, No gallops or rubs. Respiratory: has decreased air movement bilaterally. No rales, wheezing, rhonchi or rubs. GI: Soft, nondistended, nontender, no rebound pain, no organomegaly, BS present. GU: No hematuria Ext: No pitting leg edema bilaterally. 2+DP/PT pulse bilaterally. Musculoskeletal: No joint deformities, No joint redness or warmth, no limitation of ROM in spin. Skin: No rashes.  Neuro: Alert, oriented X3, cranial nerves II-XII grossly intact, moves all extremities normally. Psych: Patient is not psychotic, no suicidal or hemocidal ideation.  Labs on Admission: I have personally reviewed following labs and imaging studies  CBC:  Recent Labs Lab 10/16/16 2200  WBC 15.4*  NEUTROABS 12.5*  HGB 13.6  HCT 39.3  MCV 94.0  PLT 255   Basic Metabolic Panel:  Recent Labs Lab 10/16/16 2200  NA 136  K 2.8*  CL 101  CO2 23  GLUCOSE 198*  BUN <5*  CREATININE  0.62  CALCIUM 8.7*  MG 1.6*   GFR: Estimated Creatinine Clearance: 73.1 mL/min (by C-G formula based on SCr of 0.62 mg/dL). Liver Function Tests:  Recent Labs Lab 10/16/16 2200  AST 21  ALT 15  ALKPHOS 69  BILITOT 0.9  PROT 6.9  ALBUMIN 3.8   No results for input(s): LIPASE, AMYLASE in the last 168 hours. No results for input(s): AMMONIA in the last 168 hours. Coagulation Profile: No results for input(s): INR, PROTIME in the last 168 hours. Cardiac Enzymes:  Recent Labs Lab 10/16/16 2200  TROPONINI <0.03   BNP (last 3 results) No results for input(s): PROBNP in the last 8760 hours. HbA1C: No results for input(s): HGBA1C in the last 72 hours. CBG: No results for input(s): GLUCAP in the last 168 hours. Lipid Profile: No results for input(s): CHOL, HDL, LDLCALC, TRIG, CHOLHDL, LDLDIRECT in the last 72 hours. Thyroid Function Tests:  No results for input(s): TSH, T4TOTAL, FREET4, T3FREE, THYROIDAB in the last 72 hours. Anemia Panel: No results for input(s): VITAMINB12, FOLATE, FERRITIN, TIBC, IRON, RETICCTPCT in the last 72 hours. Urine analysis: No results found for: COLORURINE, APPEARANCEUR, LABSPEC, PHURINE, GLUCOSEU, HGBUR, BILIRUBINUR, KETONESUR, PROTEINUR, UROBILINOGEN, NITRITE, LEUKOCYTESUR Sepsis Labs: @LABRCNTIP (procalcitonin:4,lacticidven:4) )No results found for this or any previous visit (from the past 240 hour(s)).   Radiological Exams on Admission: Dg Chest 2 View  Result Date: 10/16/2016 CLINICAL DATA:  46 y/o  F; CP/sob. EXAM: CHEST  2 VIEW COMPARISON:  01/21/2014 chest radiograph FINDINGS: Stable heart size and mediastinal contours are within normal limits. Both lungs are clear. The visualized skeletal structures are unremarkable. IMPRESSION: No active cardiopulmonary disease. Electronically Signed   By: Mitzi Hansen M.D.   On: 10/16/2016 22:40     EKG: Independently reviewed.  Sinus rhythm, tachycardia, QTC 480, nonspecific T-wave  change   Assessment/Plan Principal Problem:   Acute bronchitis Active Problems:   Current tobacco use   Gastro-esophageal reflux disease without esophagitis   Major depressive disorder, recurrent, mild (HCC)   Sepsis (HCC)   ADHD (attention deficit hyperactivity disorder)   Hypokalemia   Hypomagnesemia   Alcohol use   Acute bronchitis and sepsis: Patient has productive cough, shortness of breath, chest pain and negative chest x-ray for infiltration, consistent with acute bronchitis. Patient meets criteria for sepsis with leukocytosis, fever, tachycardia and tachypnea. Pending lactic acid. Currently hemodynamically stable.  - Will place on telemetry bed for obs. - IV Rocephin and doxycycline - Mucinex for cough  - Atrovent nebs and Xopenex Neb prn for SOB - Urine pneumococcal antigen - Follow up blood culture x2, sputum culture and respiratory virus panel -check rapid strep  - will get Procalcitonin and trend lactic acid level per sepsis protocol - IVF: 2L of NS bolus in ED, followed by 125 mL per hour of NS   GERD: -Protonix  Depression, anxiety, ADHA: Stable, no suicidal or homicidal ideations. -Continue home medications: Latude, Adderall, Cymbalta, Prozac  Tobacco abuse and Alcohol use: -Did counseling about importance of quitting smoking -Nicotine patch -Did counseling about the importance of quitting drinking -CIWA protocol  Hypokalemia and Hypomagnesemia: K=2.8 and Mg=1.6 on admission. - Repleted both  DVT ppx: SQ Lovenox Code Status: Full code Family Communication: None at bed side. Disposition Plan:  Anticipate discharge back to previous home environment Consults called:  None Admission status: Obs / tele   Date of Service 10/17/2016    Lorretta Harp [facility] Pager [phone]  If 7PM-7AM, please contact night-coverage [email or web address] Password [email or web address] 10/17/2016, 2:57 AM

## 2016-10-17 NOTE — Progress Notes (Signed)
CRITICAL VALUE ALERT  Critical Value:  Lactic acid   Date & Time Notied:  10/17/2016 1335  Provider Notified: Rito EhrlichKrishnan  Orders Received/Actions taken: 10/17/2016 1348. Fluid bolus of 500 ml

## 2016-10-17 NOTE — Progress Notes (Signed)
[facility] PROGRESS NOTE  Stacie Young UJW:[id number] DOB: 1971-02-26 DOA: 10/16/2016  PCP: Angelica Chessman, MD  Brief History/Interval Summary: 46 year old Caucasian female with a past medical history of PTSD, ADHD, depression, anxiety, irritable bowel syndrome, chronic abdominal pain. Alcohol and tobacco abuse who presented with cough, shortness of breath, chest pain, and sore. She was thought to have acute bronchitis. Due to the severity of her symptoms she was hospitalized. She was also found to have lactic acidosis.  Reason for Visit: Acute bronchitis  Consultants: None  Procedures: None  Antibiotics: Ceftriaxone and doxycycline  Subjective/Interval History: Patient wants to go home. She states that she is feeling much better. Still has a cough. Does not have any expectoration at this time. Chest pain is mainly with cough and has improved.  ROS: Denies any nausea or vomiting  Objective:  Vital Signs  Vitals:   10/17/16 0209 10/17/16 0257 10/17/16 0531 10/17/16 0800  BP: 101/68  93/62   Pulse: (!) 102  87   Resp: 16  18   Temp: 98.6 F (37 C)  97.9 F (36.6 C)   TempSrc: Oral     SpO2: 94% 95% 94% 97%  Weight: 60.7 kg (133 lb 12.8 oz)     Height: 5' (1.524 m)       Intake/Output Summary (Last 24 hours) at 10/17/16 1206 Last data filed at 10/17/16 5621  Gross per 24 hour  Intake          1460.42 ml  Output                0 ml  Net          1460.42 ml   Filed Weights   10/17/16 0209  Weight: 60.7 kg (133 lb 12.8 oz)    General appearance: alert, cooperative, appears stated age, no distress and Anxious Head: Normocephalic, without obvious abnormality, atraumatic Resp: Coarse breath sounds bilaterally. No wheezing heard. Few crackles were noted at the bases, but they did decrease with deep breathing. Cardio: regular rate and rhythm, S1, S2 normal, no murmur, click, rub or gallop GI: soft, non-tender; bowel sounds normal; no masses,  no  organomegaly Extremities: extremities normal, atraumatic, no cyanosis or edema Pulses: 2+ and symmetric Neurologic: Awake, alert. Anxious. No focal neurological deficits.  Lab Results:  Data Reviewed: I have personally reviewed following labs and imaging studies  CBC:  Recent Labs Lab 10/16/16 2200  WBC 15.4*  NEUTROABS 12.5*  HGB 13.6  HCT 39.3  MCV 94.0  PLT 255    Basic Metabolic Panel:  Recent Labs Lab 10/16/16 2200  NA 136  K 2.8*  CL 101  CO2 23  GLUCOSE 198*  BUN <5*  CREATININE 0.62  CALCIUM 8.7*  MG 1.6*    GFR: Estimated Creatinine Clearance: 73.1 mL/min (by C-G formula based on SCr of 0.62 mg/dL).  Liver Function Tests:  Recent Labs Lab 10/16/16 2200  AST 21  ALT 15  ALKPHOS 69  BILITOT 0.9  PROT 6.9  ALBUMIN 3.8    Cardiac Enzymes:  Recent Labs Lab 10/16/16 2200  TROPONINI <0.03     Recent Results (from the past 240 hour(s))  Respiratory Panel by PCR     Status: None   Collection Time: 10/17/16  2:37 AM  Result Value Ref Range Status   Adenovirus NOT DETECTED NOT DETECTED Final   Coronavirus 229E NOT DETECTED NOT DETECTED Final   Coronavirus HKU1 NOT DETECTED NOT DETECTED Final   Coronavirus NL63 NOT  DETECTED NOT DETECTED Final   Coronavirus OC43 NOT DETECTED NOT DETECTED Final   Metapneumovirus NOT DETECTED NOT DETECTED Final   Rhinovirus / Enterovirus NOT DETECTED NOT DETECTED Final   Influenza A NOT DETECTED NOT DETECTED Final   Influenza B NOT DETECTED NOT DETECTED Final   Parainfluenza Virus 1 NOT DETECTED NOT DETECTED Final   Parainfluenza Virus 2 NOT DETECTED NOT DETECTED Final   Parainfluenza Virus 3 NOT DETECTED NOT DETECTED Final   Parainfluenza Virus 4 NOT DETECTED NOT DETECTED Final   Respiratory Syncytial Virus NOT DETECTED NOT DETECTED Final   Bordetella pertussis NOT DETECTED NOT DETECTED Final   Chlamydophila pneumoniae NOT DETECTED NOT DETECTED Final   Mycoplasma pneumoniae NOT DETECTED NOT DETECTED Final    Rapid strep screen (not at [facility])     Status: None   Collection Time: 10/17/16  3:00 AM  Result Value Ref Range Status   Streptococcus, Group A Screen (Direct) NEGATIVE NEGATIVE Final    Comment: (NOTE) A Rapid Antigen test may result negative if the antigen level in the sample is below the detection level of this test. The FDA has not cleared this test as a stand-alone test therefore the rapid antigen negative result has reflexed to a Group A Strep culture.   Culture, blood (routine x 2) Call MD if unable to obtain prior to antibiotics being given     Status: None (Preliminary result)   Collection Time: 10/17/16  5:13 AM  Result Value Ref Range Status   Specimen Description BLOOD LEFT HAND  Final   Special Requests   Final    BOTTLES DRAWN AEROBIC AND ANAEROBIC Blood Culture adequate volume   Culture PENDING  Incomplete   Report Status PENDING  Incomplete      Radiology Studies: Dg Chest 2 View  Result Date: 10/16/2016 CLINICAL DATA:  46 y/o  F; CP/sob. EXAM: CHEST  2 VIEW COMPARISON:  01/21/2014 chest radiograph FINDINGS: Stable heart size and mediastinal contours are within normal limits. Both lungs are clear. The visualized skeletal structures are unremarkable. IMPRESSION: No active cardiopulmonary disease. Electronically Signed   By: Mitzi HansenLance  Furusawa-Stratton M.D.   On: 10/16/2016 22:40     Medications:  Scheduled: . amphetamine-dextroamphetamine  10 mg Oral Q breakfast  . dicyclomine  20 mg Oral Q6H  . doxycycline  100 mg Oral Q12H  . DULoxetine  30 mg Oral Daily  . enoxaparin (LOVENOX) injection  40 mg Subcutaneous Q24H  . FLUoxetine  10 mg Oral Daily  . folic acid  1 mg Oral Daily  . guaiFENesin  600 mg Oral BID  . ipratropium  0.5 mg Nebulization TID BM  . levalbuterol  1.25 mg Nebulization TID  . LORazepam  0-4 mg Intravenous Q6H   Followed by  . [START ON 10/19/2016] LORazepam  0-4 mg Intravenous Q12H  . lurasidone  20 mg Oral Daily  . multivitamin with minerals   1 tablet Oral Daily  . nicotine  21 mg Transdermal Daily  . pantoprazole  40 mg Oral Daily  . thiamine  100 mg Oral Daily   Continuous: . sodium chloride 125 mL/hr at 10/17/16 1131  . cefTRIAXone (ROCEPHIN)  IV Stopped (10/17/16 0529)  . sodium chloride     ZOX:[id number]:acetaminophen, guaiFENesin, LORazepam **OR** LORazepam, oxyCODONE-acetaminophen, temazepam  Assessment/Plan:  Principal Problem:   Acute bronchitis Active Problems:   Current tobacco use   Gastro-esophageal reflux disease without esophagitis   Major depressive disorder, recurrent, mild (HCC)   Sepsis (HCC)  ADHD (attention deficit hyperactivity disorder)   Hypokalemia   Hypomagnesemia   Alcohol use    Acute bronchitis with sepsis. Chest x-ray without any infiltrates. Strep screen was negative. Lactic acid was noted to be elevated. She was given IV fluids. Repeat level was also high. She'll be given another liter of normal saline. Labs will be repeated this afternoon. Symptomatically, she appears to have improved. Continue antibiotics. Continue Mucinex. Respiratory viral panel is negative. Follow-up on blood cultures. Pro-calcitonin level less than 0.1. No need to continue with steroids.  Lactic acidosis. Possibly due to hypovolemia. Aggressive fluid hydration. Repeat labs later today.  Hypokalemia. Hypomagnesemia This was repleted. We will repeat labs.  Hyperglycemia No known history of diabetes. Elevated glucose levels could be due to steroids, although it's hard to say. Check HbA1c in the morning. Repeat labs.  History of anxiety, depression and ADHD. Continue home medications.  History of tobacco and alcohol abuse. She has been counseled. Continue CIWA protocol. Nicotine patch.  History of GERD. Continue PPI   DVT Prophylaxis: Lovenox    Code Status: Full code  Family Communication: Discussed with the patient  Disposition Plan: Management as outlined above. Mobilize.    LOS: 0 days    [facility]  [facility] Pager [phone] 10/17/2016, 12:06 PM  If 7PM-7AM, please contact night-coverage at [email or web address], password [facility]

## 2016-10-17 NOTE — ED Notes (Signed)
Patients o2 sats were 97% and pulse was 128-133 walking halfway around the unit and to the restroom.

## 2016-10-17 NOTE — Progress Notes (Signed)
Sputum collection device labelled/placed in room, pt. Made aware.

## 2016-10-17 NOTE — Progress Notes (Signed)
This is a no charge note  [facility] Dr[facility] Madilyn Hookees  46 year old lady with past medical history for GERD, depression, anxiety, PTSD, ADHD, chronic abdominal pain, tobacco abuse, alcohol use, who presents with cough, SOB, sore throat, chest pain. Patient coughs up yellow colored sputum.   Patient was found to have WBC 15.4, negative troponin, potassium 2.8, magnesium 1.6, temperature 100.2, tachycardia, tachypnea, oxygen saturation 89% on room air, negative chest x-ray for infiltration. Patient has wheezing on auscultation. Doxycycline was started for possible bronchitis. Patient is accepted to telemetry bed for observation. 40 mEq of potassium chloride and 2g of magnesium sulfate were ordered.   Please call manager or [facility] at [phone] when pt arrives to floor   Stacie HarpXilin Sheridan Gettel, MD  [facility] Pager [phone]  If 7PM-7AM, please contact night-coverage [email or web address] Password [email or web address] 10/17/2016, 12:41 AM

## 2016-10-18 LAB — BASIC METABOLIC PANEL
ANION GAP: 5 (ref 5–15)
BUN: <5 mg/dL — ABNORMAL LOW (ref 6–20)
CALCIUM: 7.8 mg/dL — AB (ref 8.9–10.3)
CHLORIDE: 112 mmol/L — AB (ref 101–111)
CO2: 23 mmol/L (ref 22–32)
CREATININE: 0.59 mg/dL (ref 0.44–1.00)
GFR calc non Af Amer: >60 mL/min (ref >60)
Glucose, Bld: 103 mg/dL — ABNORMAL HIGH (ref 65–99)
Potassium: 4 mmol/L (ref 3.5–5.1)
SODIUM: 140 mmol/L (ref 135–145)

## 2016-10-18 LAB — CBC
HEMATOCRIT: 34.1 % — AB (ref 36.0–46.0)
HEMOGLOBIN: 10.9 g/dL — AB (ref 12.0–15.0)
MCH: 30.9 pg (ref 26.0–34.0)
MCHC: 32 g/dL (ref 30.0–36.0)
MCV: 96.6 fL (ref 78.0–100.0)
Platelets: 264 10*3/uL (ref 150–400)
RBC: 3.53 MIL/uL — ABNORMAL LOW (ref 3.87–5.11)
RDW: 14.1 % (ref 11.5–15.5)
WBC: 15.1 10*3/uL — AB (ref 4.0–10.5)

## 2016-10-18 LAB — MAGNESIUM: MAGNESIUM: 2.2 mg/dL (ref 1.7–2.4)

## 2016-10-18 MED ORDER — NICOTINE 21 MG/24HR TD PT24: 21.0000 mg | MEDICATED_PATCH | Freq: Every day | TRANSDERMAL | 0 refills | Status: AC

## 2016-10-18 MED ORDER — LORAZEPAM 2 MG/ML IJ SOLN: 0.0000 mg | Freq: Four times a day (QID) | INTRAMUSCULAR | Status: DC

## 2016-10-18 MED ORDER — THIAMINE HCL 100 MG PO TABS: 100.0000 mg | ORAL_TABLET | Freq: Every day | ORAL | 0 refills | Status: AC

## 2016-10-18 MED ORDER — LORAZEPAM 2 MG/ML IJ SOLN
0.0000 mg | Freq: Two times a day (BID) | INTRAMUSCULAR | Status: DC
Start: 2016-10-19 — End: 2016-10-18

## 2016-10-18 MED ORDER — ALBUTEROL SULFATE HFA 108 (90 BASE) MCG/ACT IN AERS: 2.0000 | INHALATION_SPRAY | Freq: Four times a day (QID) | RESPIRATORY_TRACT | 1 refills | Status: AC | PRN

## 2016-10-18 MED ORDER — ADULT MULTIVITAMIN W/MINERALS CH: 1.0000 | ORAL_TABLET | Freq: Every day | ORAL | 0 refills | Status: AC

## 2016-10-18 MED ORDER — DOXYCYCLINE HYCLATE 100 MG PO TABS: 100.0000 mg | ORAL_TABLET | Freq: Two times a day (BID) | ORAL | 0 refills | Status: AC

## 2016-10-18 MED ORDER — GUAIFENESIN ER 600 MG PO TB12: 600.0000 mg | ORAL_TABLET | Freq: Two times a day (BID) | ORAL | 0 refills | Status: AC

## 2016-10-18 NOTE — Progress Notes (Signed)
NURSING PROGRESS NOTE  Stacie Young [id number] Discharge Data: 10/18/2016 12:11 PM Attending Provider: Osvaldo ShipperKrishnan, Gokul, MD WUJ:[id number]:Aguiar, Genia Delafaela M, MD     Stacie LaineAudra Igou to be D/C'd Home per MD order.  Discussed with the patient the After Visit Summary and all questions fully answered. All IV's discontinued with no bleeding noted. All belongings returned to patient for patient to take home.   Last Vital Signs:  Blood pressure 125/84, pulse 84, temperature 97.6 F (36.4 C), temperature source Oral, resp. rate 20, height 5' (1.524 m), weight 60.7 kg (133 lb 12.8 oz), last menstrual period 04/19/2011, SpO2 98 %.  Discharge Medication List Allergies as of 10/18/2016      Reactions   Glucosamine Hives   Iodine Hives   Shellfish-derived Products Hives   Pt had recently- no reaction       Medication List    STOP taking these medications   nitroGLYCERIN 0.2 mg/hr patch Commonly known as:  NITRODUR - Dosed in mg/24 hr     TAKE these medications   albuterol 108 (90 Base) MCG/ACT inhaler Commonly known as:  PROAIR HFA Inhale 2 puffs into the lungs every 6 (six) hours as needed for wheezing or shortness of breath. What changed:  how much to take  when to take this  reasons to take this   ALPRAZolam 1 MG tablet Commonly known as:  XANAX Take 0.5 mg by mouth 3 (three) times daily as needed. For anxiety   amphetamine-dextroamphetamine 10 MG tablet Commonly known as:  ADDERALL Take 10 mg by mouth daily with breakfast.   dicyclomine 20 MG tablet Commonly known as:  BENTYL Take 20 mg by mouth every 6 (six) hours.   doxycycline 100 MG tablet Commonly known as:  VIBRA-TABS Take 1 tablet (100 mg total) by mouth every 12 (twelve) hours.   guaiFENesin 600 MG 12 hr tablet Commonly known as:  MUCINEX Take 1 tablet (600 mg total) by mouth 2 (two) times daily.   methocarbamol 500 MG tablet Commonly known as:  ROBAXIN Take 500 mg by mouth 3 (three) times daily as needed for muscle  spasms.   multivitamin with minerals Tabs tablet Take 1 tablet by mouth daily.   nicotine 21 mg/24hr patch Commonly known as:  NICODERM CQ - dosed in mg/24 hours Place 1 patch (21 mg total) onto the skin daily.   pantoprazole 40 MG tablet Commonly known as:  PROTONIX Take 40 mg by mouth daily.   temazepam 30 MG capsule Commonly known as:  RESTORIL Take 1 at night for sleep   thiamine 100 MG tablet Take 1 tablet (100 mg total) by mouth daily.

## 2016-10-18 NOTE — Discharge Instructions (Signed)

## 2016-10-18 NOTE — Discharge Summary (Signed)
[facility]  Physician Discharge Summary   Patient ID: Special Ranes MRN: [id number] DOB/AGE: 45-Oct-1972 45 y.o.  Admit date: 10/16/2016 Discharge date: 10/18/2016  PCP: Angelica Chessman, MD  DISCHARGE DIAGNOSES:  Principal Problem:   Acute bronchitis Active Problems:   Current tobacco use   Gastro-esophageal reflux disease without esophagitis   Major depressive disorder, recurrent, mild (HCC)   Sepsis (HCC)   ADHD (attention deficit hyperactivity disorder)   Hypokalemia   Hypomagnesemia   Alcohol use   RECOMMENDATIONS FOR OUTPATIENT FOLLOW UP: 1. Patient advised to stop cigarettes and stop drinking alcohol 2. HbA1c result is pending  DISCHARGE CONDITION: fair  Diet recommendation: As before  [facility] Weights   10/17/16 0209  Weight: 60.7 kg (133 lb 12.8 oz)    INITIAL HISTORY: 46 year old Caucasian female with a past medical history of PTSD, ADHD, depression, anxiety, irritable bowel syndrome, chronic abdominal pain. Alcohol and tobacco abuse who presented with cough, shortness of breath, chest pain, and sore. She was thought to have acute bronchitis. Due to the severity of her symptoms she was hospitalized. She was also found to have lactic acidosis.   HOSPITAL COURSE:   Acute bronchitis with sepsis. Chest x-ray was without any infiltrates. Strep screen was negative. Lactic acid was noted to be elevated. She was given IV fluids. Patient improved rapidly. She was initially started on ceftriaxone and doxycycline. Cultures negative so far. Pro-calcitonin level less than 0.1. She will be discharged on oral doxycycline. She has ambulated in the hallway without any difficulties. Overall feels much better.   Lactic acidosis. Possibly due to hypovolemia. Improved with aggressive fluid resuscitation.  Hypokalemia. Hypomagnesemia This was repleted.   Hyperglycemia No known history of diabetes. Elevated glucose levels could be due to steroids. Glucose level 103  this morning. HbA1c is pending.  History of anxiety, depression and ADHD. Stable. Continue home medication  History of tobacco and alcohol abuse. Patient has been counseled. Nicotine patch prescribed. No evidence for withdrawal symptoms.  History of GERD. Continue PPI  Overall improved. Patient wishes to go home. Hemoglobin noted to be slightly lower today, which is due to dilution from IV fluids. No overt bleeding has been noted. Stable for discharge.   PERTINENT LABS:  The results of significant diagnostics from this hospitalization (including imaging, microbiology, ancillary and laboratory) are listed below for reference.    Microbiology: Recent Results (from the past 240 hour(s))  Respiratory Panel by PCR     Status: None   Collection Time: 10/17/16  2:37 AM  Result Value Ref Range Status   Adenovirus NOT DETECTED NOT DETECTED Final   Coronavirus 229E NOT DETECTED NOT DETECTED Final   Coronavirus HKU1 NOT DETECTED NOT DETECTED Final   Coronavirus NL63 NOT DETECTED NOT DETECTED Final   Coronavirus OC43 NOT DETECTED NOT DETECTED Final   Metapneumovirus NOT DETECTED NOT DETECTED Final   Rhinovirus / Enterovirus NOT DETECTED NOT DETECTED Final   Influenza A NOT DETECTED NOT DETECTED Final   Influenza B NOT DETECTED NOT DETECTED Final   Parainfluenza Virus 1 NOT DETECTED NOT DETECTED Final   Parainfluenza Virus 2 NOT DETECTED NOT DETECTED Final   Parainfluenza Virus 3 NOT DETECTED NOT DETECTED Final   Parainfluenza Virus 4 NOT DETECTED NOT DETECTED Final   Respiratory Syncytial Virus NOT DETECTED NOT DETECTED Final   Bordetella pertussis NOT DETECTED NOT DETECTED Final   Chlamydophila pneumoniae NOT DETECTED NOT DETECTED Final   Mycoplasma pneumoniae NOT DETECTED NOT DETECTED Final  Rapid strep screen (not  at [facility])     Status: None   Collection Time: 10/17/16  3:00 AM  Result Value Ref Range Status   Streptococcus, Group A Screen (Direct) NEGATIVE NEGATIVE Final     Comment: (NOTE) A Rapid Antigen test may result negative if the antigen level in the sample is below the detection level of this test. The FDA has not cleared this test as a stand-alone test therefore the rapid antigen negative result has reflexed to a Group A Strep culture.   Culture, blood (routine x 2) Call MD if unable to obtain prior to antibiotics being given     Status: None (Preliminary result)   Collection Time: 10/17/16  5:13 AM  Result Value Ref Range Status   Specimen Description BLOOD LEFT HAND  Final   Special Requests   Final    BOTTLES DRAWN AEROBIC AND ANAEROBIC Blood Culture adequate volume   Culture PENDING  Incomplete   Report Status PENDING  Incomplete     Labs: Basic Metabolic Panel:  Recent Labs Lab 10/16/16 2200 10/17/16 1222 10/18/16 0502  NA 136 140 140  K 2.8* 3.9 4.0  CL 101 113* 112*  CO2 23 19* 23  GLUCOSE 198* 151* 103*  BUN <5* <5* <5*  CREATININE 0.62 0.59 0.59  CALCIUM 8.7* 7.7* 7.8*  MG 1.6*  --  2.2   Liver Function Tests:  Recent Labs Lab 10/16/16 2200  AST 21  ALT 15  ALKPHOS 69  BILITOT 0.9  PROT 6.9  ALBUMIN 3.8   CBC:  Recent Labs Lab 10/16/16 2200 10/18/16 0502  WBC 15.4* 15.1*  NEUTROABS 12.5*  --   HGB 13.6 10.9*  HCT 39.3 34.1*  MCV 94.0 96.6  PLT 255 264   Cardiac Enzymes:  Recent Labs Lab 10/16/16 2200  TROPONINI <0.03     IMAGING STUDIES Dg Chest 2 View  Result Date: 10/16/2016 CLINICAL DATA:  46 y/o  F; CP/sob. EXAM: CHEST  2 VIEW COMPARISON:  01/21/2014 chest radiograph FINDINGS: Stable heart size and mediastinal contours are within normal limits. Both lungs are clear. The visualized skeletal structures are unremarkable. IMPRESSION: No active cardiopulmonary disease. Electronically Signed   By: Mitzi HansenLance  Furusawa-Stratton M.D.   On: 10/16/2016 22:40    DISCHARGE EXAMINATION: Vitals:   10/17/16 2057 10/18/16 0550 10/18/16 0552 10/18/16 0739  BP: 107/72 133/85 125/84   Pulse: 99 86 84   Resp:  20 20 20    Temp: 98.3 F (36.8 C)  97.6 F (36.4 C)   TempSrc: Oral  Oral   SpO2: 97% 98% 98% 98%  Weight:      Height:       General appearance: alert, cooperative, appears stated age and no distress Resp: Good air entry bilaterally. No crackles. No wheezing. Cardio: regular rate and rhythm, S1, S2 normal, no murmur, click, rub or gallop GI: soft, non-tender; bowel sounds normal; no masses,  no organomegaly Neurologic: No focal deficits  DISPOSITION: Home  Discharge Instructions    Call MD for:  difficulty breathing, headache or visual disturbances    Complete by:  As directed    Call MD for:  extreme fatigue    Complete by:  As directed    Call MD for:  persistant dizziness or light-headedness    Complete by:  As directed    Call MD for:  persistant nausea and vomiting    Complete by:  As directed    Call MD for:  severe uncontrolled pain    Complete by:  As directed    Call MD for:  temperature >100.4    Complete by:  As directed    Diet general    Complete by:  As directed    Discharge instructions    Complete by:  As directed    Please follow up with your PCP if symptoms do not improve or recur. Please stop smoking and drinking alcohol. Take your medications as prescribed.  You were cared for by a hospitalist during your hospital stay. If you have any questions about your discharge medications or the care you received while you were in the hospital after you are discharged, you can call the unit and asked to speak with the hospitalist on call if the hospitalist that took care of you is not available. Once you are discharged, your primary care physician will handle any further medical issues. Please note that NO REFILLS for any discharge medications will be authorized once you are discharged, as it is imperative that you return to your primary care physician (or establish a relationship with a primary care physician if you do not have one) for your aftercare needs so that they  can reassess your need for medications and monitor your lab values. If you do not have a primary care physician, you can call [phone] for a physician referral.   Increase activity slowly    Complete by:  As directed       ALLERGIES:  Allergies  Allergen Reactions  . Glucosamine Hives  . Iodine Hives  . Shellfish-Derived Products Hives    Pt had recently- no reaction      Current Discharge Medication List    START taking these medications   Details  doxycycline (VIBRA-TABS) 100 MG tablet Take 1 tablet (100 mg total) by mouth every 12 (twelve) hours. Qty: 12 tablet, Refills: 0    guaiFENesin (MUCINEX) 600 MG 12 hr tablet Take 1 tablet (600 mg total) by mouth 2 (two) times daily. Qty: 60 tablet, Refills: 0    Multiple Vitamin (MULTIVITAMIN WITH MINERALS) TABS tablet Take 1 tablet by mouth daily. Qty: 30 tablet, Refills: 0    nicotine (NICODERM CQ - DOSED IN MG/24 HOURS) 21 mg/24hr patch Place 1 patch (21 mg total) onto the skin daily. Qty: 28 patch, Refills: 0    thiamine 100 MG tablet Take 1 tablet (100 mg total) by mouth daily. Qty: 30 tablet, Refills: 0      CONTINUE these medications which have CHANGED   Details  albuterol (PROAIR HFA) 108 (90 Base) MCG/ACT inhaler Inhale 2 puffs into the lungs every 6 (six) hours as needed for wheezing or shortness of breath. Qty: 1 Inhaler, Refills: 1   Associated Diagnoses: Right hip pain      CONTINUE these medications which have NOT CHANGED   Details  ALPRAZolam (XANAX) 1 MG tablet Take 0.5 mg by mouth 3 (three) times daily as needed. For anxiety    amphetamine-dextroamphetamine (ADDERALL) 10 MG tablet Take 10 mg by mouth daily with breakfast.    dicyclomine (BENTYL) 20 MG tablet Take 20 mg by mouth every 6 (six) hours.    methocarbamol (ROBAXIN) 500 MG tablet Take 500 mg by mouth 3 (three) times daily as needed for muscle spasms. Refills: 1    pantoprazole (PROTONIX) 40 MG tablet Take 40 mg by mouth daily.    temazepam  (RESTORIL) 30 MG capsule Take 1 at night for sleep   Associated Diagnoses: Right hip pain      STOP taking these  medications     nitroGLYCERIN (NITRODUR - DOSED IN MG/24 HR) 0.2 mg/hr patch      DULoxetine (CYMBALTA) 30 MG capsule      FLUoxetine (PROZAC) 10 MG capsule      LATUDA 20 MG TABS tablet         Follow-up Information    Angelica Chessman, MD. Schedule an appointment as soon as possible for a visit in 1 week(s).   Specialty:  Family Medicine Why:  See in 1 week if symptoms don't improve, otherwise as per usual follow up schedule Contact information: [address] [place] [address] [phone]           TOTAL DISCHARGE TIME: 35 minutes  [facility]  [facility] Pager [phone]  10/18/2016, 12:17 PM

## 2016-10-19 LAB — HEMOGLOBIN A1C
HEMOGLOBIN A1C: 4.9 % (ref 4.8–5.6)
MEAN PLASMA GLUCOSE: 94 mg/dL

## 2016-10-19 LAB — CULTURE, GROUP A STREP (THRC)

## 2016-10-22 LAB — CULTURE, BLOOD (ROUTINE X 2)
Culture: NO GROWTH
Culture: NO GROWTH
SPECIAL REQUESTS: ADEQUATE

## 2017-04-30 ENCOUNTER — Other Ambulatory Visit: Payer: Self-pay | Admitting: Ophthalmology

## 2017-04-30 DIAGNOSIS — F0151 Vascular dementia with behavioral disturbance: Principal | ICD-10-CM

## 2017-04-30 DIAGNOSIS — R230 Cyanosis: Secondary | ICD-10-CM

## 2017-04-30 DIAGNOSIS — R410 Disorientation, unspecified: Principal | ICD-10-CM

## 2017-04-30 DIAGNOSIS — F05 Delirium due to known physiological condition: Secondary | ICD-10-CM

## 2017-10-15 IMAGING — CR DG CHEST 2V
2 series · 2 of 2 positions shown · non-contrast
Comparison: 01/21/2014 chest radiograph

CLINICAL DATA: 45 y/o  F; CP/sob.

EXAM:
CHEST  2 VIEW

[w chest pa]
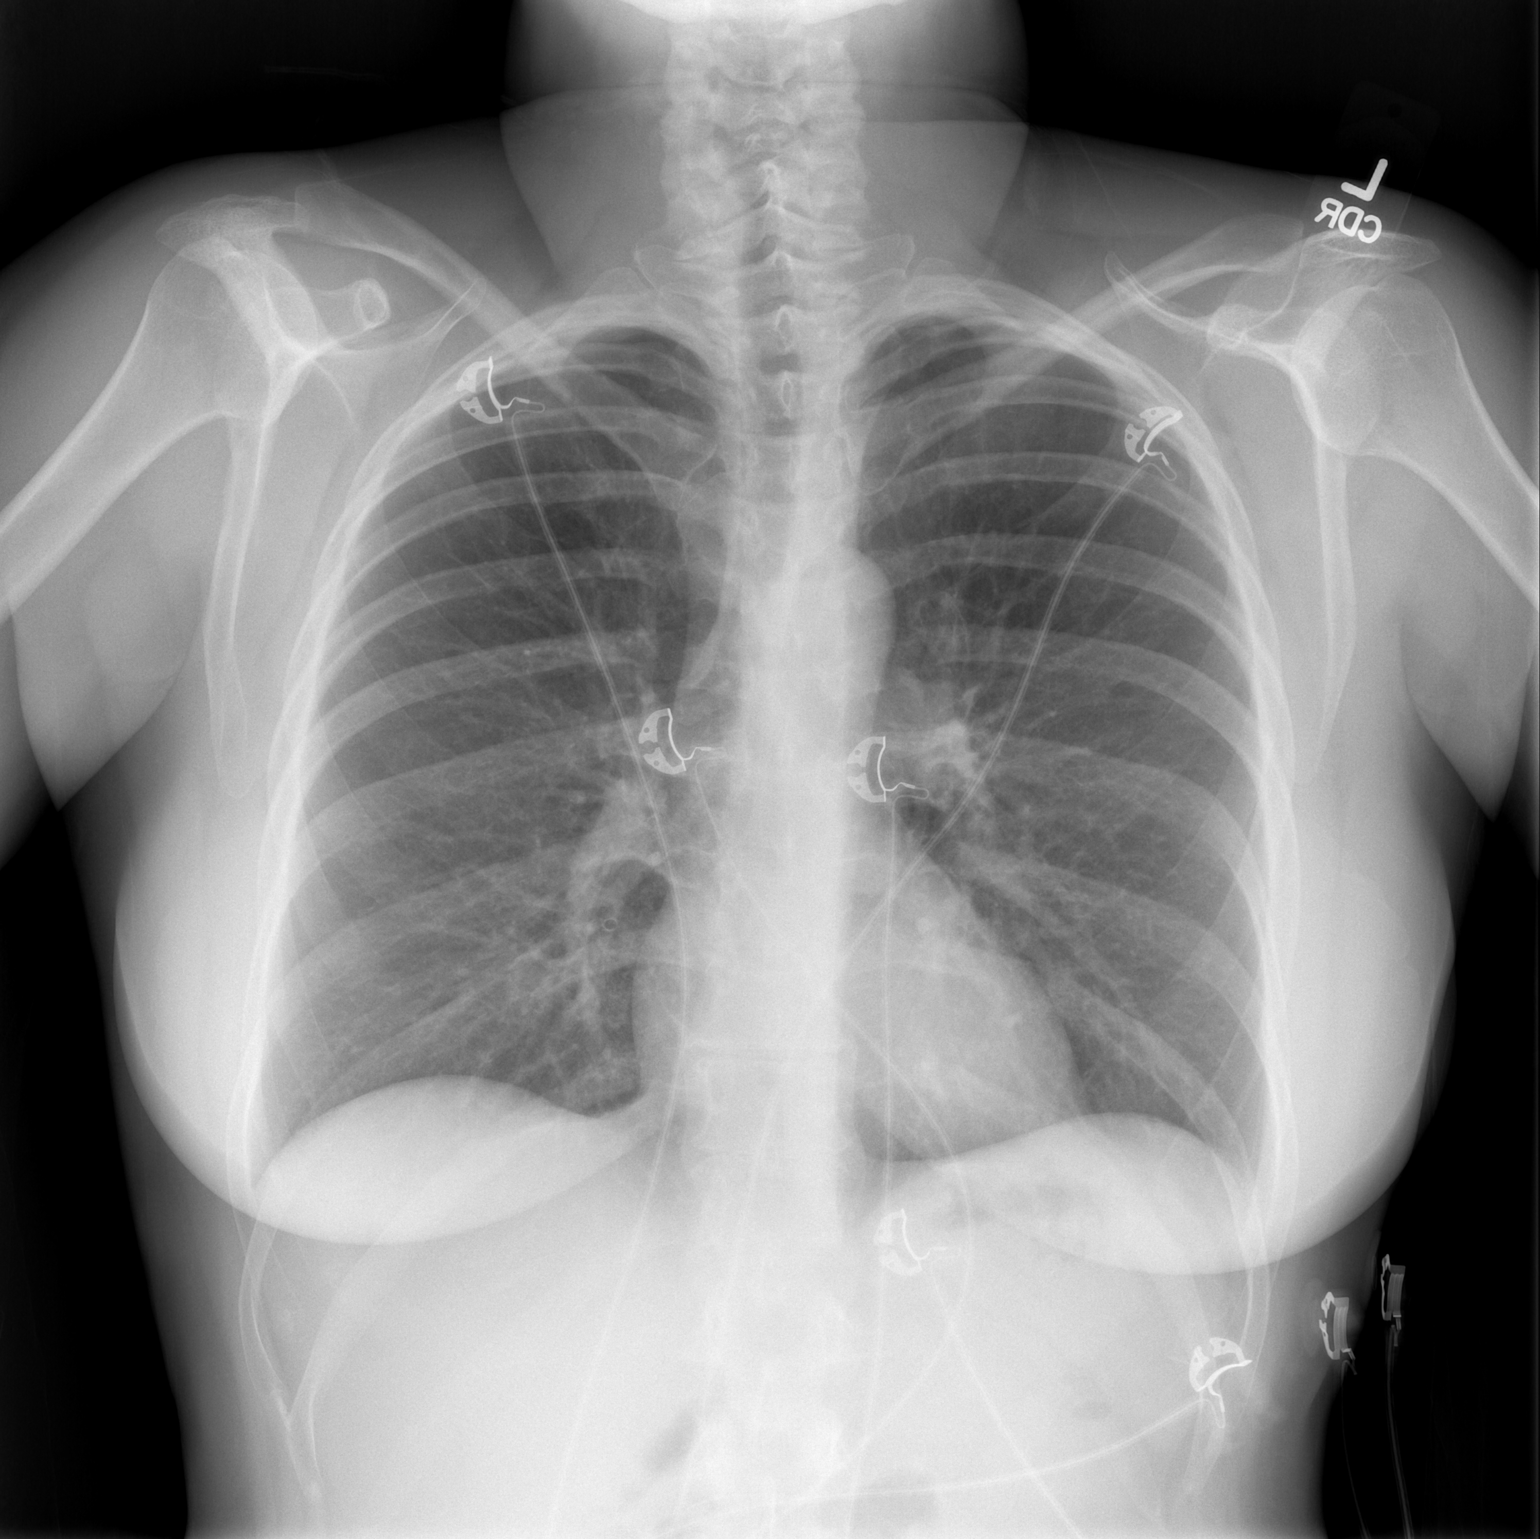

[w chest lat]
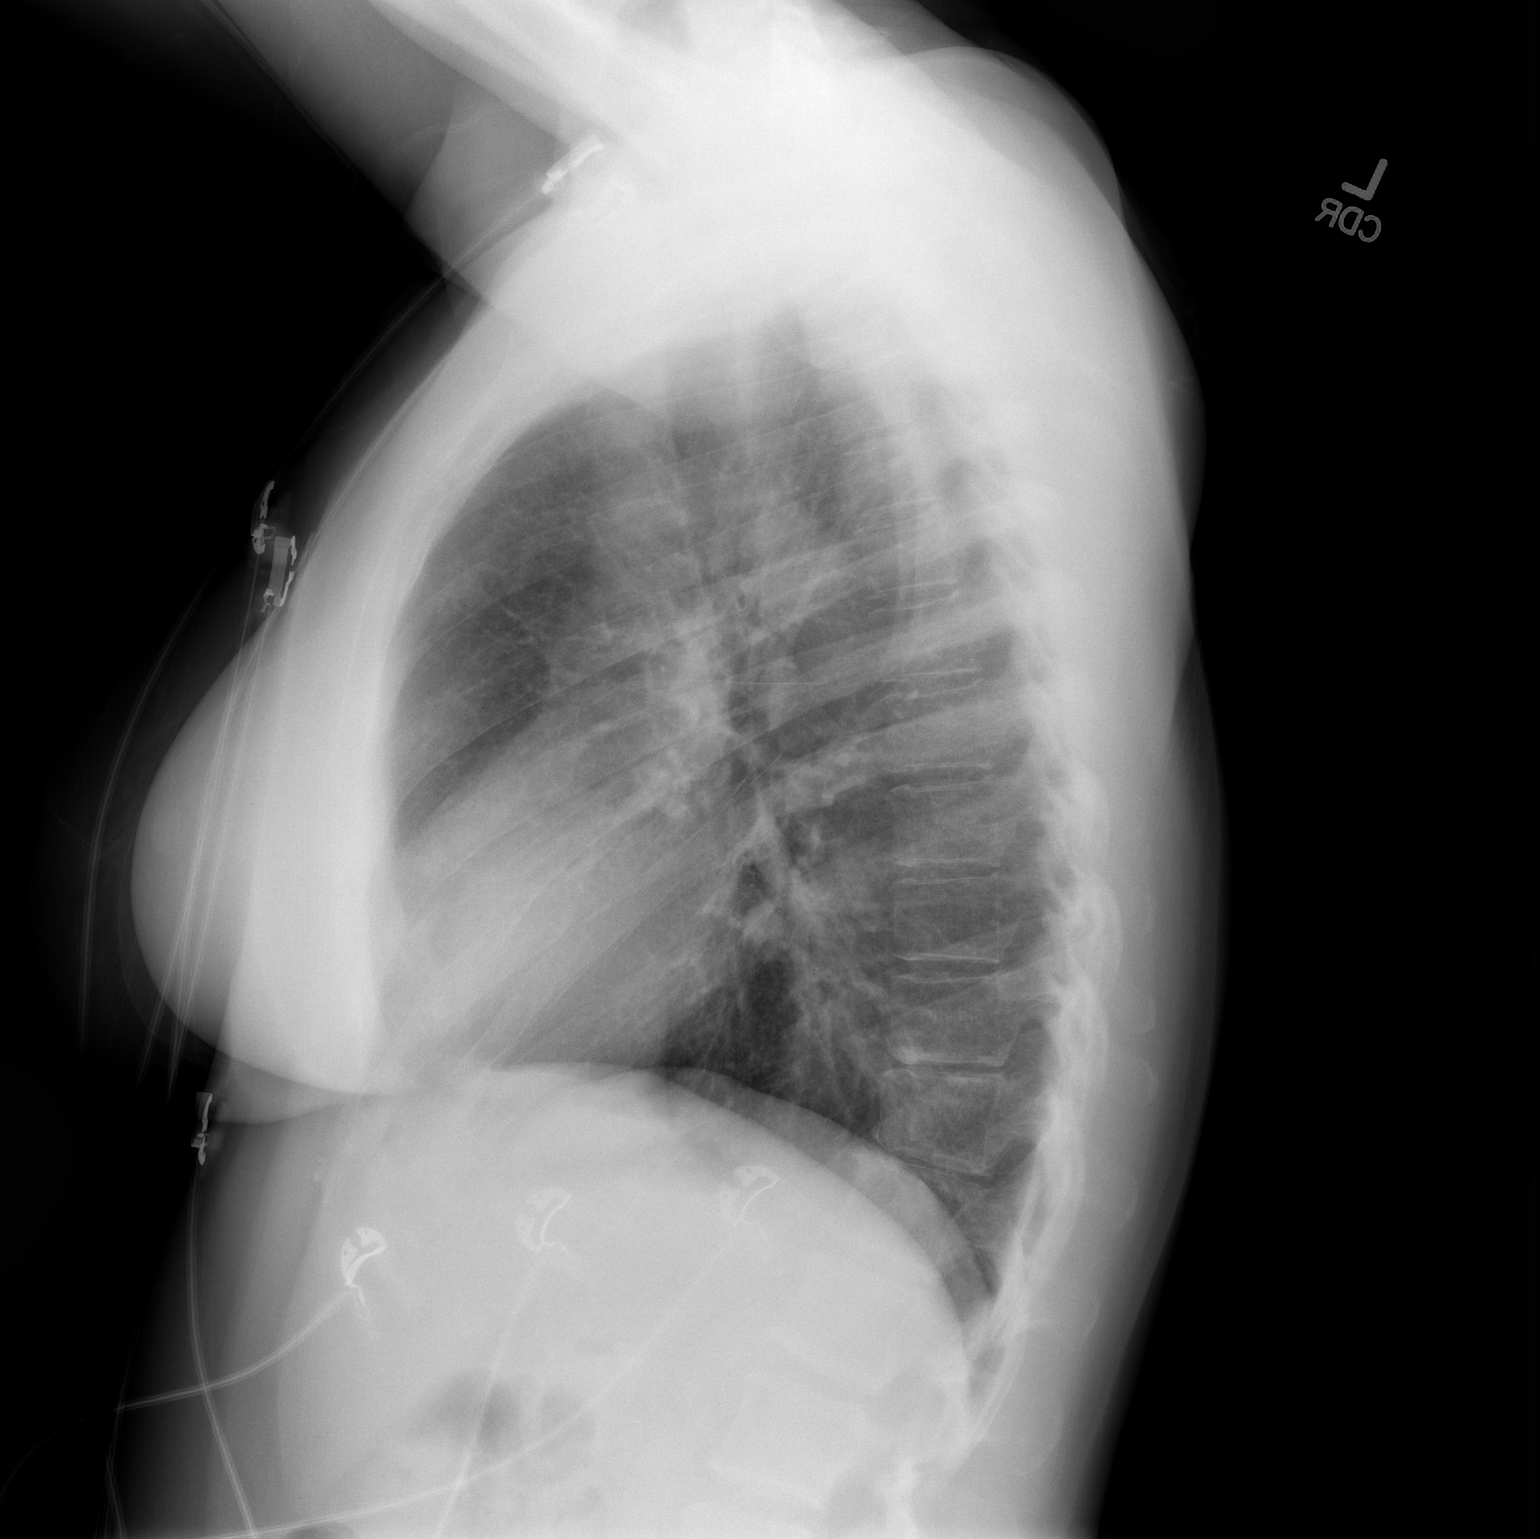

[2 of 2 positions shown; findings below may reference images not displayed]

FINDINGS: Stable heart size and mediastinal contours are within normal limits.
Both lungs are clear. The visualized skeletal structures are
unremarkable.
IMPRESSION: No active cardiopulmonary disease.

By: Sonal Mcgann M.D.

## 2018-02-11 ENCOUNTER — Encounter (HOSPITAL_BASED_OUTPATIENT_CLINIC_OR_DEPARTMENT_OTHER): Payer: Self-pay | Admitting: Emergency Medicine

## 2018-02-11 ENCOUNTER — Emergency Department (HOSPITAL_BASED_OUTPATIENT_CLINIC_OR_DEPARTMENT_OTHER)
Admission: EM | Admit: 2018-02-11 | Discharge: 2018-02-11 | Disposition: A | Discharge status: Home / Self Care | Payer: BLUE CROSS/BLUE SHIELD | Attending: Emergency Medicine | Admitting: Emergency Medicine

## 2018-02-11 ENCOUNTER — Other Ambulatory Visit: Payer: Self-pay

## 2018-02-11 ENCOUNTER — Emergency Department (HOSPITAL_BASED_OUTPATIENT_CLINIC_OR_DEPARTMENT_OTHER): Payer: BLUE CROSS/BLUE SHIELD

## 2018-02-11 DIAGNOSIS — R1032 Left lower quadrant pain: Secondary | ICD-10-CM | POA: Diagnosis present

## 2018-02-11 DIAGNOSIS — F17210 Nicotine dependence, cigarettes, uncomplicated: Secondary | ICD-10-CM | POA: Insufficient documentation

## 2018-02-11 DIAGNOSIS — Z79899 Other long term (current) drug therapy: Secondary | ICD-10-CM | POA: Diagnosis not present

## 2018-02-11 DIAGNOSIS — R109 Unspecified abdominal pain: Secondary | ICD-10-CM

## 2018-02-11 LAB — URINALYSIS, ROUTINE W REFLEX MICROSCOPIC
Bilirubin Urine: NEGATIVE
Glucose, UA: NEGATIVE mg/dL
Hgb urine dipstick: NEGATIVE
Ketones, ur: NEGATIVE mg/dL
Leukocytes, UA: NEGATIVE
Nitrite: NEGATIVE
Protein, ur: NEGATIVE mg/dL
Specific Gravity, Urine: 1.025 (ref 1.005–1.030)
pH: 6 (ref 5.0–8.0)

## 2018-02-11 LAB — BASIC METABOLIC PANEL
Anion gap: 8 (ref 5–15)
BUN: 9 mg/dL (ref 6–20)
CO2: 26 mmol/L (ref 22–32)
Calcium: 9.1 mg/dL (ref 8.9–10.3)
Chloride: 103 mmol/L (ref 98–111)
Creatinine, Ser: 0.63 mg/dL (ref 0.44–1.00)
GFR calc Af Amer: >60 mL/min (ref >60)
GFR calc non Af Amer: >60 mL/min (ref >60)
Glucose, Bld: 100 mg/dL — ABNORMAL HIGH (ref 70–99)
Potassium: 3.7 mmol/L (ref 3.5–5.1)
Sodium: 137 mmol/L (ref 135–145)

## 2018-02-11 LAB — CBC WITH DIFFERENTIAL/PLATELET
Abs Immature Granulocytes: 0.03 10*3/uL (ref 0.00–0.07)
Basophils Absolute: 0.1 10*3/uL (ref 0.0–0.1)
Basophils Relative: 1 %
Eosinophils Absolute: 0.1 10*3/uL (ref 0.0–0.5)
Eosinophils Relative: 1 %
HCT: 44.5 % (ref 36.0–46.0)
Hemoglobin: 14.9 g/dL (ref 12.0–15.0)
Immature Granulocytes: 0 %
Lymphocytes Relative: 17 %
Lymphs Abs: 1.4 10*3/uL (ref 0.7–4.0)
MCH: 33.3 pg (ref 26.0–34.0)
MCHC: 33.5 g/dL (ref 30.0–36.0)
MCV: 99.3 fL (ref 80.0–100.0)
Monocytes Absolute: 0.4 10*3/uL (ref 0.1–1.0)
Monocytes Relative: 4 %
Neutro Abs: 6.5 10*3/uL (ref 1.7–7.7)
Neutrophils Relative %: 77 %
Platelets: 339 10*3/uL (ref 150–400)
RBC: 4.48 MIL/uL (ref 3.87–5.11)
RDW: 13.5 % (ref 11.5–15.5)
WBC: 8.5 10*3/uL (ref 4.0–10.5)
nRBC: 0 % (ref 0.0–0.2)

## 2018-02-11 MED ORDER — KETOROLAC TROMETHAMINE 30 MG/ML IJ SOLN: 30.0000 mg | Freq: Once | INTRAMUSCULAR | Status: DC

## 2018-02-11 MED ORDER — KETOROLAC TROMETHAMINE 60 MG/2ML IM SOLN
30.0000 mg | Freq: Once | INTRAMUSCULAR | Status: AC
  Administered 2018-02-11: 30 mg via INTRAMUSCULAR
  Filled 2018-02-11: qty 2

## 2018-02-11 NOTE — ED Provider Notes (Signed)
[facility] EMERGENCY DEPARTMENT Provider Note   CSN: [id number] Arrival date & time: 02/11/18  0957   History   Chief Complaint Chief Complaint  Patient presents with  . Back Pain    HPI Stacie Young is a 47 y.o. female.  HPI   47 year old female presents today with complaints of left-sided flank pain.  Patient notes approximately 2 weeks ago she developed sharp pain to the left flank that is progressively worsened with no improvement in symptoms.  Patient notes this waxes and wanes but stays persistent.  Patient denies any associated urinary symptoms, denies any worsening with movement or palpation, denies any vaginal discharge bleeding, notes some nausea no vomiting.  Denies any fever.  Anti-inflammatories at home with minor improvement in symptoms.  Patient has a history of kidney stones previously.  Past Medical History:  Diagnosis Date  . Abdominal pain, chronic, generalized   . ADHD (attention deficit hyperactivity disorder)   . Anxiety   . GERD (gastroesophageal reflux disease)   . PTSD (post-traumatic stress disorder)     Patient Active Problem List   Diagnosis Date Noted  . Sepsis (HCC) 10/17/2016  . Hypokalemia 10/17/2016  . Hypomagnesemia 10/17/2016  . Alcohol use 10/17/2016  . ADHD (attention deficit hyperactivity disorder)   . Acute bronchitis   . H/O: hysterectomy 02/03/2016  . Injury of left elbow 02/03/2016  . 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 History of tubal ligation 08/04/2015  . History of endometrial ablation 08/04/2015  . Hematometra 08/04/2015  . Carpal tunnel syndrome, right 03/20/2015  . Low back pain 03/20/2015  . Irritable bowel syndrome with diarrhea 03/17/2015  . Right wrist injury 02/19/2015  . Right hip pain 02/19/2015  . Cannot sleep 06/20/2014  . Gastro-esophageal reflux disease without esophagitis 06/20/2014  . Fatigue 06/20/2014  . Current tobacco use 10/10/2013  . Episodic paroxysmal  anxiety disorder 06/12/2013  . Absolute anemia 09/23/2012  . ADD (attention deficit disorder) 09/23/2012    Past Surgical History:  Procedure Laterality Date  . BREAST SURGERY    . CESAREAN SECTION       OB History   None      Home Medications    Prior to Admission medications   Medication Sig Start Date End Date Taking? Authorizing Provider  ALPRAZolam Prudy Feeler) 1 MG tablet Take 0.5 mg by mouth 3 (three) times daily as needed. For anxiety   Yes [provider]  amphetamine-dextroamphetamine (ADDERALL) 10 MG tablet Take 10 mg by mouth daily with breakfast.   Yes [provider]  FLUoxetine (PROZAC) 10 MG tablet Take 10 mg by mouth daily.   Yes [provider]  albuterol (PROAIR HFA) 108 (90 Base) MCG/ACT inhaler Inhale 2 puffs into the lungs every 6 (six) hours as needed for wheezing or shortness of breath. 10/18/16   Osvaldo Shipper, MD  dicyclomine (BENTYL) 20 MG tablet Take 20 mg by mouth every 6 (six) hours.    [provider]  guaiFENesin (MUCINEX) 600 MG 12 hr tablet Take 1 tablet (600 mg total) by mouth 2 (two) times daily. 10/18/16   Osvaldo Shipper, MD  methocarbamol (ROBAXIN) 500 MG tablet Take 500 mg by mouth 3 (three) times daily as needed for muscle spasms. 08/25/16   [provider]  Multiple Vitamin (MULTIVITAMIN WITH MINERALS) TABS tablet Take 1 tablet by mouth daily. 10/18/16   Osvaldo Shipper, MD  nicotine (NICODERM CQ - DOSED IN MG/24 HOURS) 21 mg/24hr patch Place 1 patch (21 mg  total) onto the skin daily. 10/18/16   Osvaldo Shipper, MD  pantoprazole (PROTONIX) 40 MG tablet Take 40 mg by mouth daily.    [provider]  temazepam (RESTORIL) 30 MG capsule Take 1 at night for sleep 01/06/15   [provider]  thiamine 100 MG tablet Take 1 tablet (100 mg total) by mouth daily. 10/18/16   Osvaldo Shipper, MD    Family History Family History  Problem Relation Age of Onset  . Stroke Father     Social History Social  History   Tobacco Use  . Smoking status: Current Every Day Smoker    Packs/day: 1.00    Types: Cigarettes  . Smokeless tobacco: Never Used  Substance Use Topics  . Alcohol use: Yes    Alcohol/week: 28.0 standard drinks    Types: 14 Glasses of wine, 14 Cans of beer per week  . Drug use: No     Allergies   Glucosamine; Iodine; and Shellfish-derived products   Review of Systems Review of Systems  All other systems reviewed and are negative.    Physical Exam Updated Vital Signs BP (!) 135/93 (BP Location: Left Arm)   Pulse 82   Temp 98.3 F (36.8 C) (Oral)   Resp 20   Ht 5\' 1"  (1.549 m)   Wt 55.3 kg   LMP 04/19/2011   SpO2 100%   BMI 23.05 kg/m   Physical Exam  Constitutional: She is oriented to person, place, and time. She appears well-developed and well-nourished.  HENT:  Head: Normocephalic and atraumatic.  Eyes: Pupils are equal, round, and reactive to light. Conjunctivae are normal. Right eye exhibits no discharge. Left eye exhibits no discharge. No scleral icterus.  Neck: Normal range of motion. No JVD present. No tracheal deviation present.  Pulmonary/Chest: Effort normal. No stridor.  Abdominal:  Minor tenderness palpation of left lower abdomen, soft no rebound or guarding manner abdomen soft nontender  Musculoskeletal:  Normal tenderness palpation of left lower lumbar musculature, remainder of back nontender to palpation  Neurological: She is alert and oriented to person, place, and time. Coordination normal.  Psychiatric: She has a normal mood and affect. Her behavior is normal. Judgment and thought content normal.  Nursing note and vitals reviewed.    ED Treatments / Results  Labs (all labs ordered are listed, but only abnormal results are displayed) Labs Reviewed  BASIC METABOLIC PANEL - Abnormal; Notable for the following components:      Result Value   Glucose, Bld 100 (*)    All other components within normal limits  CBC WITH  DIFFERENTIAL/PLATELET  URINALYSIS, ROUTINE W REFLEX MICROSCOPIC    EKG None  Radiology Ct Renal Stone Study  Result Date: 02/11/2018 CLINICAL DATA:  Left lower abdominal pain for 2 weeks. No dysuria. History of kidney stones in the past. EXAM: CT ABDOMEN AND PELVIS WITHOUT CONTRAST TECHNIQUE: Multidetector CT imaging of the abdomen and pelvis was performed following the standard protocol without IV contrast. COMPARISON:  01/31/2014 FINDINGS: Lower chest: Clear lung bases.  Heart is normal in size. Hepatobiliary: There is decreased attenuation of the liver consistent with fatty infiltration. Liver is normal in size. No mass or focal lesion. Normal gallbladder. No bile duct dilation. Pancreas: Unremarkable. No pancreatic ductal dilatation or surrounding inflammatory changes. Spleen: Normal in size without focal abnormality. Adrenals/Urinary Tract: No adrenal masses. Kidneys are normal size, orientation and position. No renal masses. 1 mm stone in the midpole of the left kidney. No other intrarenal stones. No  hydronephrosis. Ureters are normal in course and in caliber. Bladder is unremarkable. Stomach/Bowel: Stomach is within normal limits. Appendix appears normal. No evidence of bowel wall thickening, distention, or inflammatory changes. Vascular/Lymphatic: No significant vascular findings are present. No enlarged abdominal or pelvic lymph nodes. Reproductive: Status post hysterectomy. No adnexal masses. Other: No abdominal wall hernia or abnormality. No abdominopelvic ascites. Musculoskeletal: No fracture or acute finding. No osteoblastic or osteolytic lesions. Disk degenerative changes noted of the lower thoracic spine. IMPRESSION: 1. No acute findings.  No ureteral stone or obstructive uropathy. 2. 1 mm nonobstructing stone in the midpole of the left kidney. 3. Hepatic steatosis. Electronically Signed   By: Amie Portland M.D.   On: 02/11/2018 11:12    Procedures Procedures (including critical care  time)  Medications Ordered in ED Medications  ketorolac (TORADOL) injection 30 mg (30 mg Intramuscular Given 02/11/18 1053)     Initial Impression / Assessment and Plan / ED Course  I have reviewed the triage vital signs and the nursing notes.  Pertinent labs & imaging results that were available during my care of the patient were reviewed by me and considered in my medical decision making (see chart for details).     Labs: CBC, BMP  Imaging: CT renal  Consults:  Therapeutics: Toradol  Discharge Meds:   Assessment/Plan: 47 year old female presents today with complaints of back pain.  Likely muscular in nature.  Reassuring work-up, no signs of infectious etiology or acute intra-abdominal pathology.  Discharged with outpatient follow-up and strict return precautions.  Verbalized understanding and agreement to today's plan.    Final Clinical Impressions(s) / ED Diagnoses   Final diagnoses:  Flank pain    ED Discharge Orders    None       Rosalio Loud 02/11/18 1136    Jacalyn Lefevre, MD 02/11/18 1230

## 2018-02-11 NOTE — Discharge Instructions (Addendum)
Please read attached information. If you experience any new or worsening signs or symptoms please return to the emergency room for evaluation. Please follow-up with your primary care provider or specialist as discussed.  °

## 2018-02-11 NOTE — ED Triage Notes (Signed)
L low back/flank pain x 1 week with urinary frequency.

## 2020-03-19 ENCOUNTER — Encounter (HOSPITAL_BASED_OUTPATIENT_CLINIC_OR_DEPARTMENT_OTHER): Payer: Self-pay | Admitting: *Deleted

## 2020-03-19 ENCOUNTER — Emergency Department (HOSPITAL_BASED_OUTPATIENT_CLINIC_OR_DEPARTMENT_OTHER)
Admission: EM | Admit: 2020-03-19 | Discharge: 2020-03-19 | Disposition: A | Discharge status: Home / Self Care | Payer: BC Managed Care – PPO | Attending: Emergency Medicine | Admitting: Emergency Medicine

## 2020-03-19 ENCOUNTER — Other Ambulatory Visit: Payer: Self-pay

## 2020-03-19 DIAGNOSIS — Z5321 Procedure and treatment not carried out due to patient leaving prior to being seen by health care provider: Secondary | ICD-10-CM | POA: Insufficient documentation

## 2020-03-19 DIAGNOSIS — R42 Dizziness and giddiness: Secondary | ICD-10-CM | POA: Diagnosis present

## 2020-03-19 NOTE — ED Triage Notes (Signed)
C/o dizziness and " vertigo " x 5 days, seen at [facility] ed yesterday for same , labs and ct head neg

## 2020-04-30 ENCOUNTER — Emergency Department
Admission: EM | Admit: 2020-04-30 | Discharge: 2020-04-30 | Discharge status: Absent without leave | Payer: BC Managed Care – PPO | Source: Home / Self Care

## 2020-10-07 ENCOUNTER — Ambulatory Visit (HOSPITAL_COMMUNITY)
Admission: RE | Admit: 2020-10-07 | Discharge: 2020-10-07 | Disposition: A | Discharge status: Home / Self Care | Payer: BC Managed Care – PPO | Attending: Psychiatry | Admitting: Psychiatry

## 2020-10-07 DIAGNOSIS — F321 Major depressive disorder, single episode, moderate: Secondary | ICD-10-CM

## 2020-10-07 DIAGNOSIS — F329 Major depressive disorder, single episode, unspecified: Secondary | ICD-10-CM | POA: Diagnosis present

## 2020-10-07 DIAGNOSIS — F1010 Alcohol abuse, uncomplicated: Secondary | ICD-10-CM | POA: Diagnosis present
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
## 2020-10-08 ENCOUNTER — Telehealth (HOSPITAL_COMMUNITY): Payer: Self-pay | Admitting: Licensed Clinical Social Worker

## 2021-04-20 ENCOUNTER — Encounter (HOSPITAL_BASED_OUTPATIENT_CLINIC_OR_DEPARTMENT_OTHER): Payer: Self-pay | Admitting: Emergency Medicine

## 2021-04-20 ENCOUNTER — Emergency Department (HOSPITAL_BASED_OUTPATIENT_CLINIC_OR_DEPARTMENT_OTHER): Payer: BC Managed Care – PPO

## 2021-04-20 ENCOUNTER — Emergency Department (HOSPITAL_BASED_OUTPATIENT_CLINIC_OR_DEPARTMENT_OTHER)
Admission: EM | Admit: 2021-04-20 | Discharge: 2021-04-20 | Disposition: A | Discharge status: Home / Self Care | Payer: BC Managed Care – PPO | Attending: Emergency Medicine | Admitting: Emergency Medicine

## 2021-04-20 ENCOUNTER — Other Ambulatory Visit: Payer: Self-pay

## 2021-04-20 DIAGNOSIS — R0789 Other chest pain: Secondary | ICD-10-CM

## 2021-04-20 DIAGNOSIS — R059 Cough, unspecified: Secondary | ICD-10-CM | POA: Insufficient documentation

## 2021-04-20 DIAGNOSIS — R0781 Pleurodynia: Secondary | ICD-10-CM | POA: Insufficient documentation

## 2021-04-20 LAB — CBC
HCT: 39.9 % (ref 36.0–46.0)
Hemoglobin: 13.3 g/dL (ref 12.0–15.0)
MCH: 30.4 pg (ref 26.0–34.0)
MCHC: 33.3 g/dL (ref 30.0–36.0)
MCV: 91.1 fL (ref 80.0–100.0)
Platelets: 413 10*3/uL — ABNORMAL HIGH (ref 150–400)
RBC: 4.38 MIL/uL (ref 3.87–5.11)
RDW: 15 % (ref 11.5–15.5)
WBC: 9.7 10*3/uL (ref 4.0–10.5)
nRBC: 0 % (ref 0.0–0.2)

## 2021-04-20 LAB — BASIC METABOLIC PANEL
Anion gap: 7 (ref 5–15)
BUN: 7 mg/dL (ref 6–20)
CO2: 27 mmol/L (ref 22–32)
Calcium: 8.9 mg/dL (ref 8.9–10.3)
Chloride: 107 mmol/L (ref 98–111)
Creatinine, Ser: 0.67 mg/dL (ref 0.44–1.00)
GFR, Estimated: >60 mL/min (ref >60)
Glucose, Bld: 95 mg/dL (ref 70–99)
Potassium: 3.6 mmol/L (ref 3.5–5.1)
Sodium: 141 mmol/L (ref 135–145)

## 2021-04-20 LAB — D-DIMER, QUANTITATIVE: D-Dimer, Quant: <0.27 ug/mL-FEU (ref 0.00–0.50)

## 2021-04-20 LAB — TROPONIN I (HIGH SENSITIVITY): Troponin I (High Sensitivity): <2 ng/L (ref <18)

## 2021-04-20 MED ORDER — IBUPROFEN 400 MG PO TABS
400.0000 mg | ORAL_TABLET | Freq: Once | ORAL | Status: AC
  Administered 2021-04-20: 400 mg via ORAL
  Filled 2021-04-20: qty 1

## 2021-04-20 MED ORDER — ACETAMINOPHEN 500 MG PO TABS
1000.0000 mg | ORAL_TABLET | Freq: Once | ORAL | Status: AC
Start: 2021-04-20 — End: 2021-04-20
  Administered 2021-04-20: 1000 mg via ORAL
  Filled 2021-04-20: qty 2

## 2021-04-20 MED ORDER — METHOCARBAMOL 750 MG PO TABS: 750.0000 mg | ORAL_TABLET | Freq: Three times a day (TID) | ORAL | 0 refills | Status: AC | PRN

## 2021-04-20 NOTE — Discharge Instructions (Addendum)
It was our pleasure to provide your ER care today - we hope that you feel better.  Take acetaminophen or ibuprofen as need for pain. You may also take robaxin as need for muscle pain/spasm - no driving when taking.   Follow up with primary care doctor in 1-2 weeks if symptoms fail to improve/resolve.  Return to ER if worse, new symptoms, trouble breathing, or other concern.

## 2021-04-20 NOTE — ED Provider Notes (Signed)
[facility] Provider Note   CSN: [id number] Arrival date & time: 04/20/21  0803     History  Chief Complaint  Patient presents with   Chest Pain    Rib pain    Chattie Renter is a 51 y.o. female.  Patient c/o pain to left lower, lateral chest wall and rib area. Symptoms acute onset in past week, dull, moderate, constant, worse w occasional non prod cough (pt indicates her normal 'smokers cough'), with certain movements/positional changes, and with deep breath. Denies any trauma, injury, fall or strain to area. No anterior chest pain. No exertional chest pain. No associated sob, nv or diaphoresis. No leg pain or swelling. No hx dvt or pe. No midline/spine pain. No fever or chills. No abd pain or nv.   The history is provided by the patient and medical records.  Chest Pain Associated symptoms: cough   Associated symptoms: no abdominal pain, no fever, no headache, no nausea, no shortness of breath and no vomiting       Home Medications Prior to Admission medications   Medication Sig Start Date End Date Taking? Authorizing Provider  albuterol (PROAIR HFA) 108 (90 Base) MCG/ACT inhaler Inhale 2 puffs into the lungs every 6 (six) hours as needed for wheezing or shortness of breath. 10/18/16   Bonnielee Haff, MD  ALPRAZolam Duanne Moron) 1 MG tablet Take 0.5 mg by mouth 3 (three) times daily as needed. For anxiety    [provider]  amphetamine-dextroamphetamine (ADDERALL) 10 MG tablet Take 10 mg by mouth daily with breakfast.    [provider]  dicyclomine (BENTYL) 20 MG tablet Take 20 mg by mouth every 6 (six) hours.    [provider]  FLUoxetine (PROZAC) 10 MG tablet Take 10 mg by mouth daily.    [provider]  guaiFENesin (MUCINEX) 600 MG 12 hr tablet Take 1 tablet (600 mg total) by mouth 2 (two) times daily. 10/18/16   Bonnielee Haff, MD  methocarbamol (ROBAXIN) 500 MG tablet Take 500 mg by mouth 3 (three) times daily as  needed for muscle spasms. 08/25/16   [provider]  Multiple Vitamin (MULTIVITAMIN WITH MINERALS) TABS tablet Take 1 tablet by mouth daily. 10/18/16   Bonnielee Haff, MD  nicotine (NICODERM CQ - DOSED IN MG/24 HOURS) 21 mg/24hr patch Place 1 patch (21 mg total) onto the skin daily. 10/18/16   Bonnielee Haff, MD  pantoprazole (PROTONIX) 40 MG tablet Take 40 mg by mouth daily.    [provider]  temazepam (RESTORIL) 30 MG capsule Take 1 at night for sleep 01/06/15   [provider]  thiamine 100 MG tablet Take 1 tablet (100 mg total) by mouth daily. 10/18/16   Bonnielee Haff, MD      Allergies    Glucosamine, Iodine, and Shellfish-derived products    Review of Systems   Review of Systems  Constitutional:  Negative for chills and fever.  HENT:  Negative for sore throat.   Eyes:  Negative for redness.  Respiratory:  Positive for cough. Negative for shortness of breath.   Cardiovascular:  Positive for chest pain. Negative for leg swelling.  Gastrointestinal:  Negative for abdominal pain, nausea and vomiting.  Genitourinary:  Negative for flank pain.  Musculoskeletal:  Negative for neck pain.  Skin:  Negative for rash.  Neurological:  Negative for headaches.  Hematological:  Does not bruise/bleed easily.  Psychiatric/Behavioral:  Negative for confusion.    Physical Exam Updated Vital Signs BP 114/79  Pulse 65    Temp 97.8 F (36.6 C) (Oral)    Resp 13    Ht 1.524 m (5')    Wt 51.3 kg    LMP 04/19/2011    SpO2 99%    BMI 22.07 kg/m  Physical Exam Vitals and nursing note reviewed.  Constitutional:      Appearance: Normal appearance. She is well-developed.  HENT:     Head: Atraumatic.     Nose: Nose normal.     Mouth/Throat:     Mouth: Mucous membranes are moist.  Eyes:     General: No scleral icterus.    Conjunctiva/sclera: Conjunctivae normal.  Neck:     Trachea: No tracheal deviation.  Cardiovascular:     Rate and Rhythm: Normal rate and regular  rhythm.     Pulses: Normal pulses.     Heart sounds: Normal heart sounds. No murmur heard.   No friction rub. No gallop.  Pulmonary:     Effort: Pulmonary effort is normal. No respiratory distress.     Breath sounds: Normal breath sounds.     Comments: +tenderness left lower/lateral chest wall. No sts to area. No skin lesions/rash to area.  Abdominal:     General: Bowel sounds are normal. There is no distension.     Palpations: Abdomen is soft.     Tenderness: There is no abdominal tenderness.  Genitourinary:    Comments: No cva tenderness.  Musculoskeletal:        General: No swelling or tenderness.     Cervical back: Normal range of motion and neck supple. No rigidity. No muscular tenderness.     Right lower leg: No edema.     Left lower leg: No edema.     Comments: T/L spine non tender, aligned. No sts noted. No skin lesions.   Skin:    General: Skin is warm and dry.     Findings: No rash.  Neurological:     Mental Status: She is alert.     Comments: Alert, speech normal.   Psychiatric:        Mood and Affect: Mood normal.    ED Results / Procedures / Treatments   Labs (all labs ordered are listed, but only abnormal results are displayed) Results for orders placed or performed during the hospital encounter of [id number]  Basic metabolic panel  Result Value Ref Range   Sodium 141 135 - 145 mmol/L   Potassium 3.6 3.5 - 5.1 mmol/L   Chloride 107 98 - 111 mmol/L   CO2 27 22 - 32 mmol/L   Glucose, Bld 95 70 - 99 mg/dL   BUN 7 6 - 20 mg/dL   Creatinine, Ser 0.67 0.44 - 1.00 mg/dL   Calcium 8.9 8.9 - 10.3 mg/dL   GFR, Estimated >60 >60 mL/min   Anion gap 7 5 - 15  CBC  Result Value Ref Range   WBC 9.7 4.0 - 10.5 K/uL   RBC 4.38 3.87 - 5.11 MIL/uL   Hemoglobin 13.3 12.0 - 15.0 g/dL   HCT 39.9 36.0 - 46.0 %   MCV 91.1 80.0 - 100.0 fL   MCH 30.4 26.0 - 34.0 pg   MCHC 33.3 30.0 - 36.0 g/dL   RDW 15.0 11.5 - 15.5 %   Platelets 413 (H) 150 - 400 K/uL   nRBC 0.0 0.0 - 0.2  %  D-dimer, quantitative  Result Value Ref Range   D-Dimer, Quant <0.27 0.00 - 0.50 ug/mL-FEU  Troponin I (High  Sensitivity)  Result Value Ref Range   Troponin I (High Sensitivity) <2 <18 ng/L   DG Chest 2 View  Result Date: 04/20/2021 CLINICAL DATA:  Left-sided flank pain since Wednesday. No known injury EXAM: CHEST - 2 VIEW COMPARISON:  03/25/2020 FINDINGS: Normal heart size and mediastinal contours. No acute infiltrate or edema. No effusion or pneumothorax. No acute osseous findings. Artifact from EKG leads. IMPRESSION: No active cardiopulmonary disease. Electronically Signed   By: Jorje Guild M.D.   On: 04/20/2021 09:00    EKG EKG Interpretation  Date/Time:  Monday April 20 2021 08:26:21 EST Ventricular Rate:  82 PR Interval:  118 QRS Duration: 76 QT Interval:  396 QTC Calculation: 462 R Axis:   80 Text Interpretation: Normal sinus rhythm Nonspecific T wave abnormality Confirmed by Lajean Saver [phone]) on 04/20/2021 8:56:08 AM  Radiology DG Chest 2 View  Result Date: 04/20/2021 CLINICAL DATA:  Left-sided flank pain since Wednesday. No known injury EXAM: CHEST - 2 VIEW COMPARISON:  03/25/2020 FINDINGS: Normal heart size and mediastinal contours. No acute infiltrate or edema. No effusion or pneumothorax. No acute osseous findings. Artifact from EKG leads. IMPRESSION: No active cardiopulmonary disease. Electronically Signed   By: Jorje Guild M.D.   On: 04/20/2021 09:00    Procedures Procedures    Medications Ordered in ED Medications  acetaminophen (TYLENOL) tablet 1,000 mg (1,000 mg Oral Given 04/20/21 0945)  ibuprofen (ADVIL) tablet 400 mg (400 mg Oral Given 04/20/21 0945)    ED Course/ Medical Decision Making/ A&P                           Medical Decision Making  Labs and imaging ordered.   Diff dx including pe, acs, msk pain, pna, and other serious conditions  - labs and imaging pending.   Reviewed nursing notes and prior charts for additional history.  External records reviewed.   Labs reviewed/interpreted by me - trop normal, ddimer normal - after symptoms for past several days, felt not c/w acs, not c/s pe.   Acetaminophen, ibuprofen po.  CXR reviewed/interpreted by me - no pna.   Recheck, pt appears comfortable, nad, and currently appears stable for d/c.   Rec pcp f/u.  Rx provided.   Return precautions provided.           Final Clinical Impression(s) / ED Diagnoses Final diagnoses:  None    Rx / DC Orders ED Discharge Orders     None         Lajean Saver, MD 04/20/21 1038

## 2021-04-20 NOTE — ED Triage Notes (Signed)
Left rib pain, radiating to front started Wednesday.  No N/V.  Some sob.  Some coughing.  Pain with coughing and very deep breath.  No recent travel.  No fever.  No dysuria.

## 2022-04-19 IMAGING — CR DG CHEST 2V
2 series · 2 of 2 positions shown · non-contrast
Comparison: 03/25/2020

CLINICAL DATA: Left-sided flank pain since [REDACTED]. No known
injury

EXAM:
CHEST - 2 VIEW

[w chest pa]
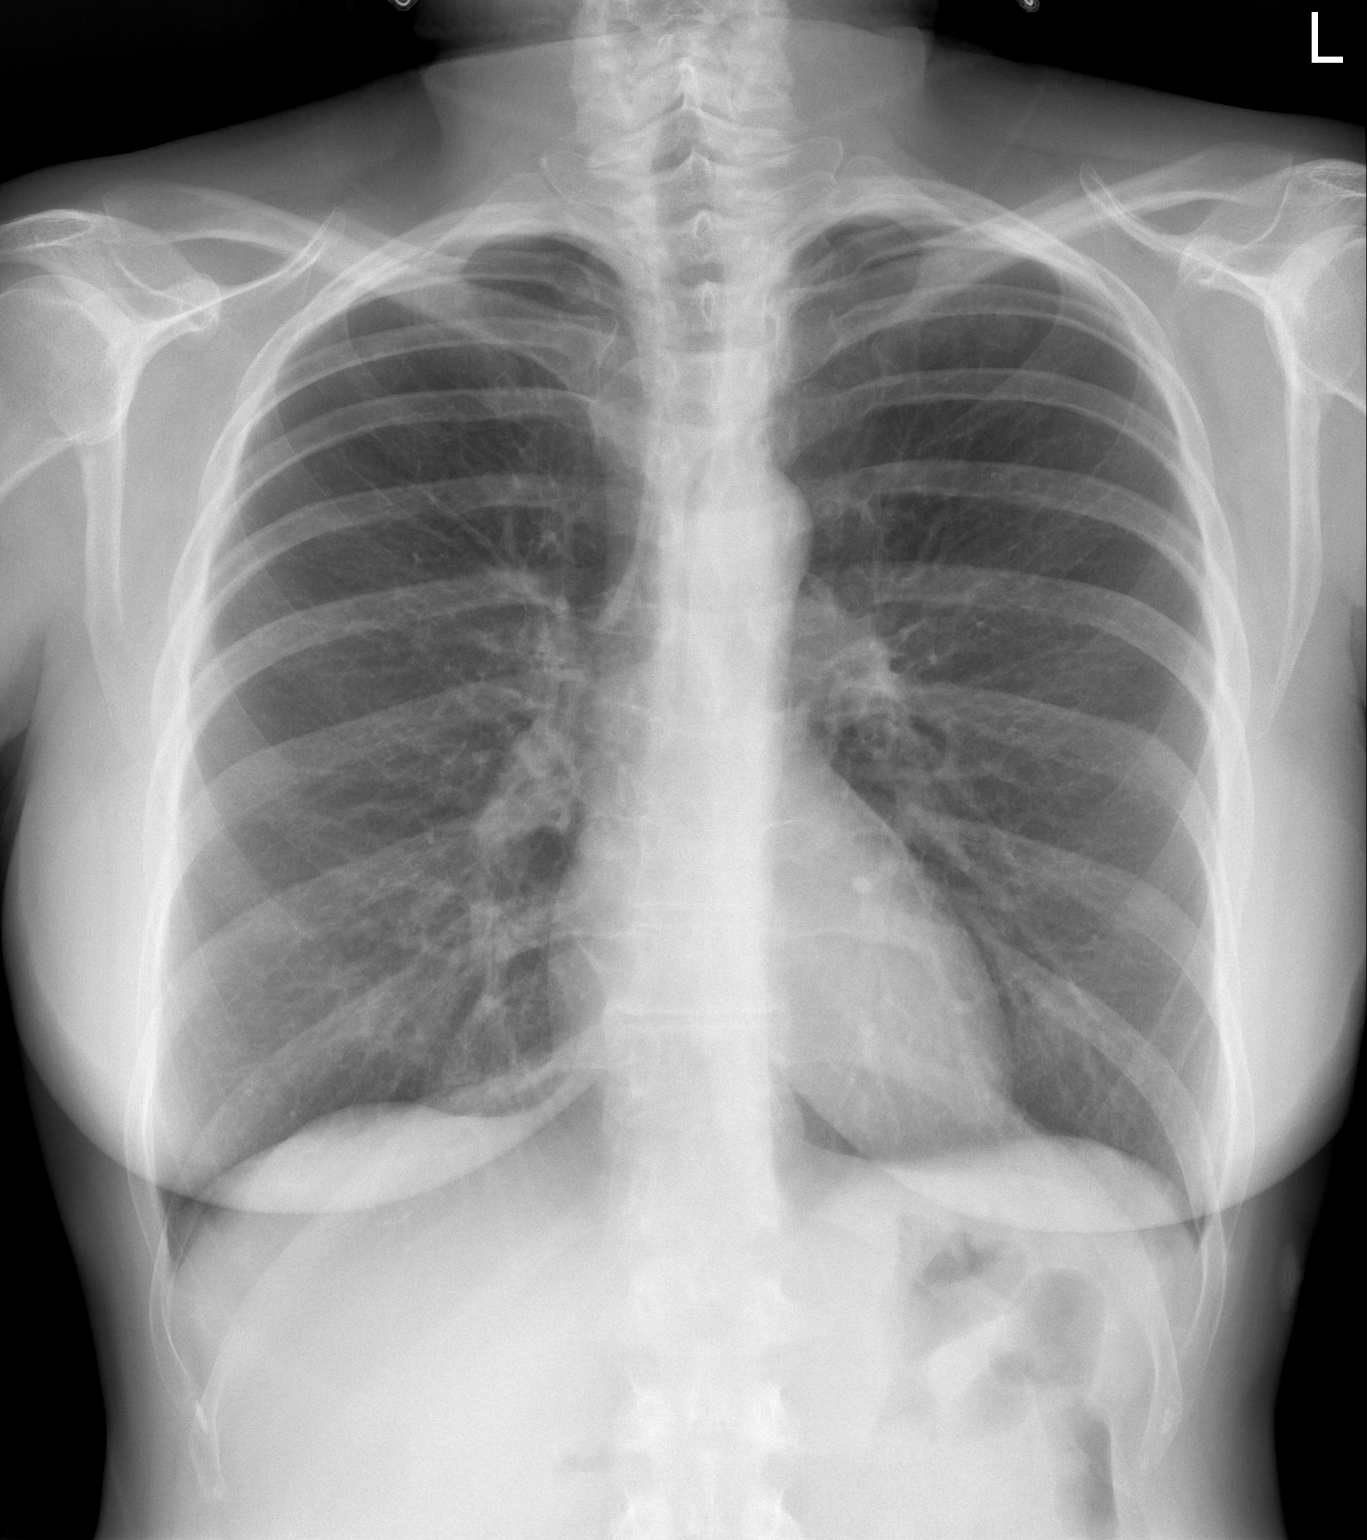

[w chest lat]
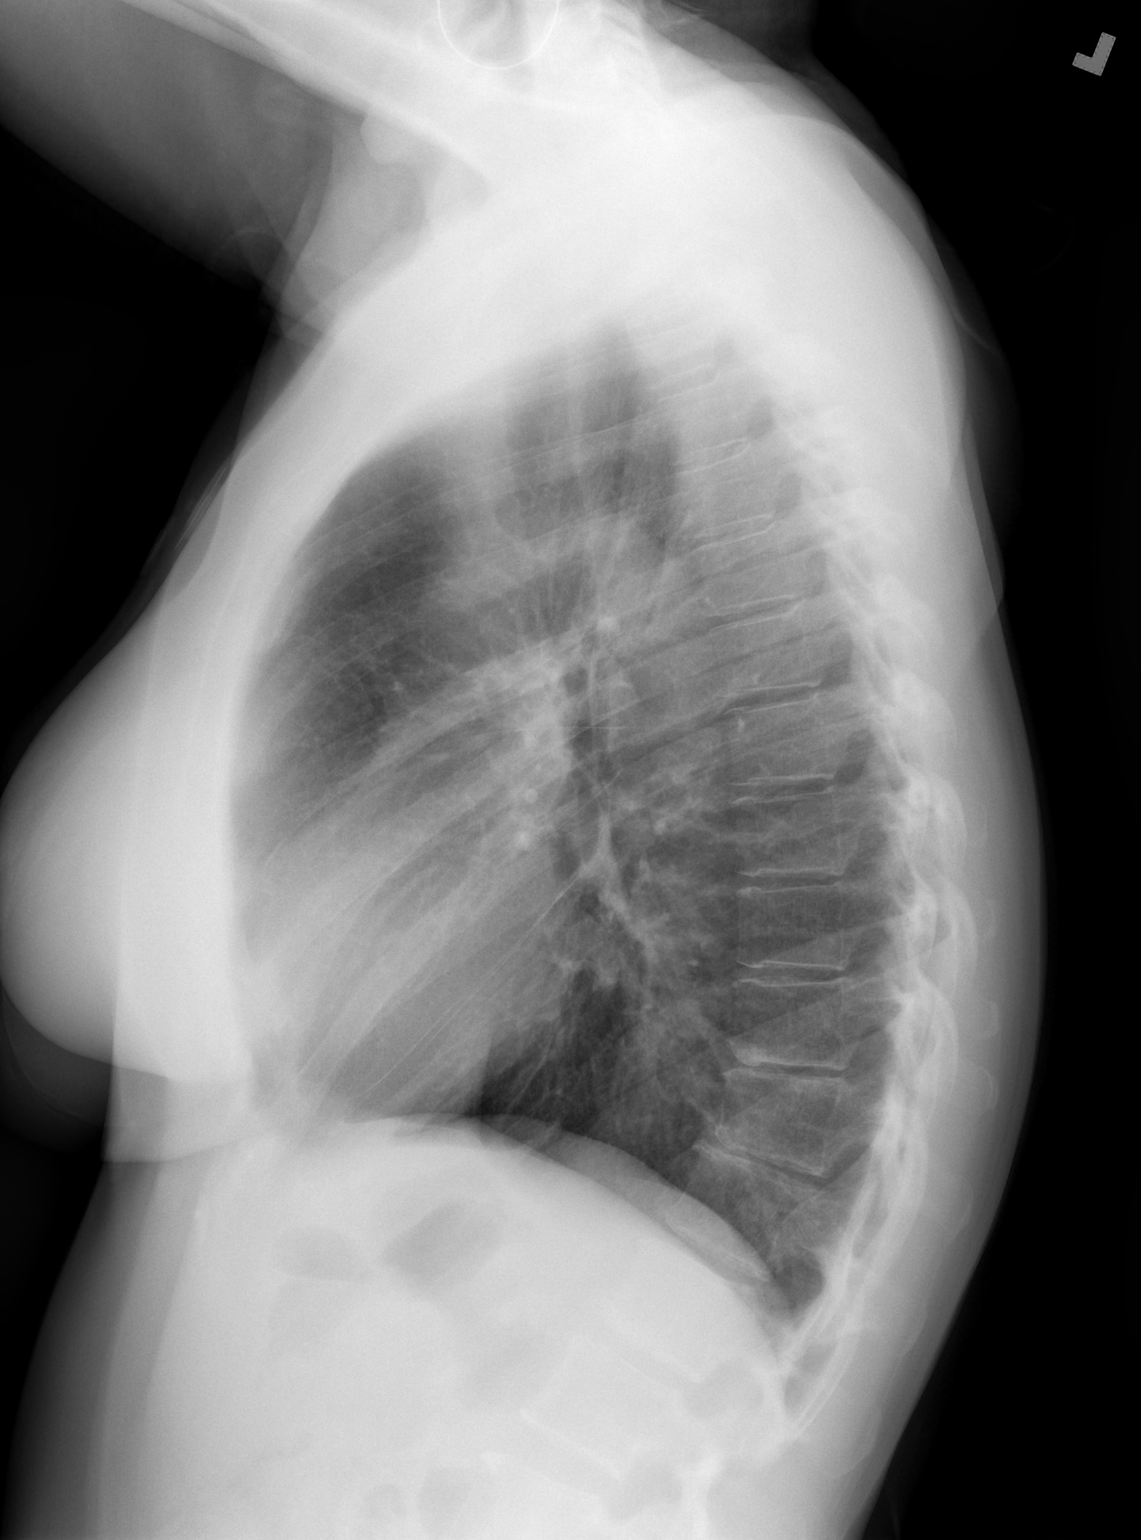

[2 of 2 positions shown; findings below may reference images not displayed]

FINDINGS: Normal heart size and mediastinal contours. No acute infiltrate or
edema. No effusion or pneumothorax. No acute osseous findings.

Artifact from EKG leads.
IMPRESSION: No active cardiopulmonary disease.

## 2022-11-22 ENCOUNTER — Emergency Department (HOSPITAL_BASED_OUTPATIENT_CLINIC_OR_DEPARTMENT_OTHER)
Admission: EM | Admit: 2022-11-22 | Discharge: 2022-11-22 | Disposition: A | Discharge status: Home / Self Care | Payer: BC Managed Care – PPO | Source: Home / Self Care | Attending: Emergency Medicine | Admitting: Emergency Medicine

## 2022-11-22 ENCOUNTER — Encounter (HOSPITAL_BASED_OUTPATIENT_CLINIC_OR_DEPARTMENT_OTHER): Payer: Self-pay

## 2022-11-22 ENCOUNTER — Other Ambulatory Visit (HOSPITAL_BASED_OUTPATIENT_CLINIC_OR_DEPARTMENT_OTHER): Payer: Self-pay

## 2022-11-22 DIAGNOSIS — E876 Hypokalemia: Secondary | ICD-10-CM

## 2022-11-22 DIAGNOSIS — R112 Nausea with vomiting, unspecified: Secondary | ICD-10-CM

## 2022-11-22 LAB — COMPREHENSIVE METABOLIC PANEL
ALT: 28 U/L (ref 0–44)
AST: 26 U/L (ref 15–41)
Albumin: 4.5 g/dL (ref 3.5–5.0)
Alkaline Phosphatase: 81 U/L (ref 38–126)
Anion gap: 18 — ABNORMAL HIGH (ref 5–15)
BUN: 12 mg/dL (ref 6–20)
CO2: 22 mmol/L (ref 22–32)
Calcium: 9.5 mg/dL (ref 8.9–10.3)
Chloride: 96 mmol/L — ABNORMAL LOW (ref 98–111)
Creatinine, Ser: 0.82 mg/dL (ref 0.44–1.00)
GFR, Estimated: >60 mL/min (ref >60)
Glucose, Bld: 108 mg/dL — ABNORMAL HIGH (ref 70–99)
Potassium: 3 mmol/L — ABNORMAL LOW (ref 3.5–5.1)
Sodium: 136 mmol/L (ref 135–145)
Total Bilirubin: 1.2 mg/dL (ref 0.3–1.2)
Total Protein: 7.8 g/dL (ref 6.5–8.1)

## 2022-11-22 LAB — URINALYSIS, ROUTINE W REFLEX MICROSCOPIC
Glucose, UA: NEGATIVE mg/dL
Hgb urine dipstick: NEGATIVE
Ketones, ur: >=80 mg/dL — AB
Leukocytes,Ua: NEGATIVE
Nitrite: NEGATIVE
Protein, ur: 100 mg/dL — AB
Specific Gravity, Urine: >=1.03 (ref 1.005–1.030)
pH: 6 (ref 5.0–8.0)

## 2022-11-22 LAB — CBC
HCT: 45.6 % (ref 36.0–46.0)
Hemoglobin: 15.7 g/dL — ABNORMAL HIGH (ref 12.0–15.0)
MCH: 28.4 pg (ref 26.0–34.0)
MCHC: 34.4 g/dL (ref 30.0–36.0)
MCV: 82.5 fL (ref 80.0–100.0)
Platelets: 424 10*3/uL — ABNORMAL HIGH (ref 150–400)
RBC: 5.53 MIL/uL — ABNORMAL HIGH (ref 3.87–5.11)
RDW: 14 % (ref 11.5–15.5)
WBC: 12 10*3/uL — ABNORMAL HIGH (ref 4.0–10.5)
nRBC: 0 % (ref 0.0–0.2)

## 2022-11-22 LAB — URINALYSIS, MICROSCOPIC (REFLEX)

## 2022-11-22 LAB — LIPASE, BLOOD: Lipase: 47 U/L (ref 11–51)

## 2022-11-22 MED ORDER — METOCLOPRAMIDE HCL 5 MG/ML IJ SOLN
10.0000 mg | Freq: Once | INTRAMUSCULAR | Status: AC
  Administered 2022-11-22: 10 mg via INTRAVENOUS
  Filled 2022-11-22: qty 2

## 2022-11-22 MED ORDER — POTASSIUM CHLORIDE CRYS ER 20 MEQ PO TBCR
40.0000 meq | EXTENDED_RELEASE_TABLET | Freq: Once | ORAL | Status: AC
  Administered 2022-11-22: 40 meq via ORAL
  Filled 2022-11-22: qty 2

## 2022-11-22 MED ORDER — ONDANSETRON HCL 4 MG/2ML IJ SOLN
4.0000 mg | Freq: Once | INTRAMUSCULAR | Status: AC | PRN
  Administered 2022-11-22: 4 mg via INTRAVENOUS
  Filled 2022-11-22: qty 2

## 2022-11-22 MED ORDER — METOCLOPRAMIDE HCL 10 MG PO TABS
10.0000 mg | ORAL_TABLET | Freq: Four times a day (QID) | ORAL | 0 refills | Status: AC | PRN
  Filled 2022-11-22: qty 6, 2d supply, fill #0

## 2022-11-22 MED ORDER — SODIUM CHLORIDE 0.9 % IV BOLUS
1000.0000 mL | Freq: Once | INTRAVENOUS | Status: AC
  Administered 2022-11-22: 1000 mL via INTRAVENOUS

## 2022-11-22 MED ORDER — PANTOPRAZOLE SODIUM 40 MG IV SOLR
40.0000 mg | Freq: Once | INTRAVENOUS | Status: AC
  Administered 2022-11-22: 40 mg via INTRAVENOUS
  Filled 2022-11-22: qty 10

## 2022-11-22 NOTE — ED Provider Notes (Signed)
[facility] AT [facility] Provider Note   CSN: [id number] Arrival date & time: 11/22/22  0840     History  Chief Complaint  Patient presents with   Abdominal Pain    Stacie Young is a 52 y.o. female.  Patient is a 52 year old female who presents with nausea and vomiting.  She has been semaglutide and recently increased her dose to 20 mg.  Her first injection was this past Thursday.  She started having some nausea after that and has had some ongoing nausea and vomiting.  She denies any associate abdominal pain other than some soreness from the vomiting.  No fevers.  No change in her stools.  No urinary symptoms.  No cough or cold symptoms.       Home Medications Prior to Admission medications   Medication Sig Start Date End Date Taking? Authorizing Provider  metoCLOPramide (REGLAN) 10 MG tablet Take 1 tablet (10 mg total) by mouth every 6 (six) hours as needed for nausea (nausea/headache). 11/22/22  Yes Rolan Bucco, MD  albuterol (PROAIR HFA) 108 (90 Base) MCG/ACT inhaler Inhale 2 puffs into the lungs every 6 (six) hours as needed for wheezing or shortness of breath. 10/18/16   Osvaldo Shipper, MD  ALPRAZolam Prudy Feeler) 1 MG tablet Take 0.5 mg by mouth 3 (three) times daily as needed. For anxiety    [provider]  amphetamine-dextroamphetamine (ADDERALL) 10 MG tablet Take 10 mg by mouth daily with breakfast.    [provider]  dicyclomine (BENTYL) 20 MG tablet Take 20 mg by mouth every 6 (six) hours.    [provider]  FLUoxetine (PROZAC) 10 MG tablet Take 10 mg by mouth daily.    [provider]  guaiFENesin (MUCINEX) 600 MG 12 hr tablet Take 1 tablet (600 mg total) by mouth 2 (two) times daily. 10/18/16   Osvaldo Shipper, MD  methocarbamol (ROBAXIN) 750 MG tablet Take 1 tablet (750 mg total) by mouth 3 (three) times daily as needed (muscle spasm/pain). 04/20/21   Cathren Laine, MD  Multiple Vitamin (MULTIVITAMIN WITH  MINERALS) TABS tablet Take 1 tablet by mouth daily. 10/18/16   Osvaldo Shipper, MD  nicotine (NICODERM CQ - DOSED IN MG/24 HOURS) 21 mg/24hr patch Place 1 patch (21 mg total) onto the skin daily. 10/18/16   Osvaldo Shipper, MD  pantoprazole (PROTONIX) 40 MG tablet Take 40 mg by mouth daily.    [provider]  temazepam (RESTORIL) 30 MG capsule Take 1 at night for sleep 01/06/15   [provider]  thiamine 100 MG tablet Take 1 tablet (100 mg total) by mouth daily. 10/18/16   Osvaldo Shipper, MD      Allergies    Glucosamine, Iodine, and Shellfish-derived products    Review of Systems   Review of Systems  Constitutional:  Negative for chills, diaphoresis, fatigue and fever.  HENT:  Negative for congestion, rhinorrhea and sneezing.   Eyes: Negative.   Respiratory:  Negative for cough, chest tightness and shortness of breath.   Cardiovascular:  Negative for chest pain and leg swelling.  Gastrointestinal:  Positive for nausea and vomiting. Negative for abdominal pain, blood in stool and diarrhea.  Genitourinary:  Negative for difficulty urinating, flank pain, frequency and hematuria.  Musculoskeletal:  Negative for arthralgias and back pain.  Skin:  Negative for rash.  Neurological:  Negative for dizziness, speech difficulty, weakness, numbness and headaches.    Physical Exam Updated Vital Signs BP 116/79   Pulse 73  Temp 98.3 F (36.8 C) (Oral)   Resp 17   Ht 5' (1.524 m)   Wt 64.4 kg   LMP 04/19/2011   SpO2 98%   BMI 27.73 kg/m  Physical Exam Constitutional:      Appearance: She is well-developed.  HENT:     Head: Normocephalic and atraumatic.  Eyes:     Pupils: Pupils are equal, round, and reactive to light.  Cardiovascular:     Rate and Rhythm: Normal rate and regular rhythm.     Heart sounds: Normal heart sounds.  Pulmonary:     Effort: Pulmonary effort is normal. No respiratory distress.     Breath sounds: Normal breath sounds. No wheezing or rales.   Chest:     Chest wall: No tenderness.  Abdominal:     General: Bowel sounds are normal.     Palpations: Abdomen is soft.     Tenderness: There is no abdominal tenderness. There is no guarding or rebound.  Musculoskeletal:        General: Normal range of motion.     Cervical back: Normal range of motion and neck supple.  Lymphadenopathy:     Cervical: No cervical adenopathy.  Skin:    General: Skin is warm and dry.     Findings: No rash.  Neurological:     Mental Status: She is alert and oriented to person, place, and time.     ED Results / Procedures / Treatments   Labs (all labs ordered are listed, but only abnormal results are displayed) Labs Reviewed  COMPREHENSIVE METABOLIC PANEL - Abnormal; Notable for the following components:      Result Value   Potassium 3.0 (*)    Chloride 96 (*)    Glucose, Bld 108 (*)    Anion gap 18 (*)    All other components within normal limits  CBC - Abnormal; Notable for the following components:   WBC 12.0 (*)    RBC 5.53 (*)    Hemoglobin 15.7 (*)    Platelets 424 (*)    All other components within normal limits  URINALYSIS, ROUTINE W REFLEX MICROSCOPIC - Abnormal; Notable for the following components:   Bilirubin Urine SMALL (*)    Ketones, ur >=80 (*)    Protein, ur 100 (*)    All other components within normal limits  URINALYSIS, MICROSCOPIC (REFLEX) - Abnormal; Notable for the following components:   Bacteria, UA FEW (*)    All other components within normal limits  LIPASE, BLOOD    EKG None  Radiology No results found.  Procedures Procedures    Medications Ordered in ED Medications  potassium chloride SA (KLOR-CON M) CR tablet 40 mEq (has no administration in time range)  ondansetron (ZOFRAN) injection 4 mg (4 mg Intravenous Given 11/22/22 0912)  sodium chloride 0.9 % bolus 1,000 mL (0 mLs Intravenous Stopped 11/22/22 1046)  pantoprazole (PROTONIX) injection 40 mg (40 mg Intravenous Given 11/22/22 0942)   metoCLOPramide (REGLAN) injection 10 mg (10 mg Intravenous Given 11/22/22 1057)    ED Course/ Medical Decision Making/ A&P                                 Medical Decision Making Amount and/or Complexity of Data Reviewed Labs: ordered.  Risk Prescription drug management.   Patient is a 52 year old who presents with nausea and vomiting after increasing her dose of semaglutide.  She has no associate abdominal  pain.  She was given IV fluids and antiemetics.  Her labs reviewed.  Her WBC count is elevated but likely reactive.  She has low potassium but this treated in the emergency department.  She has no evidence of pancreatitis.  No evidence of hepatitis.  No tenderness over her gallbladder which would be more concerning for cholecystitis.  She is feeling better after this and is tolerating oral fluids.  She was discharged home in good condition.  She was given prescriptions for Reglan which seemed to have worked the best and improving her symptoms.  She was encouraged to follow-up with her primary care doctor regarding ongoing management of the semaglutide. return precautions were given.  Final Clinical Impression(s) / ED Diagnoses Final diagnoses:  Nausea and vomiting, unspecified vomiting type  Hypokalemia    Rx / DC Orders ED Discharge Orders          Ordered    metoCLOPramide (REGLAN) 10 MG tablet  Every 6 hours PRN        11/22/22 1200              Rolan Bucco, MD 11/22/22 1208

## 2022-11-22 NOTE — ED Triage Notes (Signed)
Pt reports to ED with complaints of constant nausea since Thursday. States that she upped her dosage of semiglutide and has been sick ever since. States that she is having abdominal pain from vomiting.

## 2022-11-22 NOTE — ED Notes (Signed)
Patient tolerating po intake at this time.

## 2023-10-19 ENCOUNTER — Encounter (HOSPITAL_BASED_OUTPATIENT_CLINIC_OR_DEPARTMENT_OTHER): Payer: Self-pay

## 2023-10-19 ENCOUNTER — Other Ambulatory Visit: Payer: Self-pay

## 2023-10-19 ENCOUNTER — Emergency Department (HOSPITAL_BASED_OUTPATIENT_CLINIC_OR_DEPARTMENT_OTHER)
Admission: EM | Admit: 2023-10-19 | Discharge: 2023-10-19 | Disposition: A | Discharge status: Home / Self Care | Attending: Emergency Medicine | Admitting: Emergency Medicine

## 2023-10-19 DIAGNOSIS — E876 Hypokalemia: Secondary | ICD-10-CM | POA: Diagnosis not present

## 2023-10-19 DIAGNOSIS — R197 Diarrhea, unspecified: Secondary | ICD-10-CM | POA: Insufficient documentation

## 2023-10-19 DIAGNOSIS — D72829 Elevated white blood cell count, unspecified: Secondary | ICD-10-CM | POA: Insufficient documentation

## 2023-10-19 DIAGNOSIS — R112 Nausea with vomiting, unspecified: Secondary | ICD-10-CM | POA: Diagnosis present

## 2023-10-19 LAB — BASIC METABOLIC PANEL WITH GFR
Anion gap: 16 — ABNORMAL HIGH (ref 5–15)
BUN: 10 mg/dL (ref 6–20)
CO2: 26 mmol/L (ref 22–32)
Calcium: 9.3 mg/dL (ref 8.9–10.3)
Chloride: 95 mmol/L — ABNORMAL LOW (ref 98–111)
Creatinine, Ser: 0.79 mg/dL (ref 0.44–1.00)
GFR, Estimated: >60 mL/min (ref >60)
Glucose, Bld: 104 mg/dL — ABNORMAL HIGH (ref 70–99)
Potassium: 3.1 mmol/L — ABNORMAL LOW (ref 3.5–5.1)
Sodium: 136 mmol/L (ref 135–145)

## 2023-10-19 LAB — URINALYSIS, ROUTINE W REFLEX MICROSCOPIC
Glucose, UA: NEGATIVE mg/dL
Hgb urine dipstick: NEGATIVE
Ketones, ur: >=80 mg/dL — AB
Leukocytes,Ua: NEGATIVE
Nitrite: NEGATIVE
Protein, ur: 30 mg/dL — AB
Specific Gravity, Urine: >=1.03 (ref 1.005–1.030)
pH: 6 (ref 5.0–8.0)

## 2023-10-19 LAB — COMPREHENSIVE METABOLIC PANEL WITH GFR
ALT: 17 U/L (ref 0–44)
AST: 27 U/L (ref 15–41)
Albumin: 4.8 g/dL (ref 3.5–5.0)
Alkaline Phosphatase: 86 U/L (ref 38–126)
Anion gap: 19 — ABNORMAL HIGH (ref 5–15)
BUN: 11 mg/dL (ref 6–20)
CO2: 23 mmol/L (ref 22–32)
Calcium: 9.6 mg/dL (ref 8.9–10.3)
Chloride: 94 mmol/L — ABNORMAL LOW (ref 98–111)
Creatinine, Ser: 0.82 mg/dL (ref 0.44–1.00)
GFR, Estimated: >60 mL/min (ref >60)
Glucose, Bld: 116 mg/dL — ABNORMAL HIGH (ref 70–99)
Potassium: 3 mmol/L — ABNORMAL LOW (ref 3.5–5.1)
Sodium: 136 mmol/L (ref 135–145)
Total Bilirubin: 0.7 mg/dL (ref 0.0–1.2)
Total Protein: 7.6 g/dL (ref 6.5–8.1)

## 2023-10-19 LAB — CBC
HCT: 44.9 % (ref 36.0–46.0)
Hemoglobin: 15.6 g/dL — ABNORMAL HIGH (ref 12.0–15.0)
MCH: 28.7 pg (ref 26.0–34.0)
MCHC: 34.7 g/dL (ref 30.0–36.0)
MCV: 82.7 fL (ref 80.0–100.0)
Platelets: 392 10*3/uL (ref 150–400)
RBC: 5.43 MIL/uL — ABNORMAL HIGH (ref 3.87–5.11)
RDW: 13.2 % (ref 11.5–15.5)
WBC: 13.1 10*3/uL — ABNORMAL HIGH (ref 4.0–10.5)
nRBC: 0 % (ref 0.0–0.2)

## 2023-10-19 LAB — URINALYSIS, MICROSCOPIC (REFLEX)

## 2023-10-19 LAB — LIPASE, BLOOD: Lipase: 37 U/L (ref 11–51)

## 2023-10-19 MED ORDER — DIPHENHYDRAMINE HCL 25 MG PO CAPS
25.0000 mg | ORAL_CAPSULE | Freq: Once | ORAL | Status: AC
  Administered 2023-10-19: 25 mg via ORAL
  Filled 2023-10-19: qty 1

## 2023-10-19 MED ORDER — POTASSIUM CHLORIDE ER 10 MEQ PO TBCR: 10.0000 meq | EXTENDED_RELEASE_TABLET | Freq: Every day | ORAL | 0 refills | Status: AC

## 2023-10-19 MED ORDER — ONDANSETRON HCL 4 MG/2ML IJ SOLN
4.0000 mg | Freq: Once | INTRAMUSCULAR | Status: AC | PRN
  Administered 2023-10-19: 4 mg via INTRAVENOUS
  Filled 2023-10-19: qty 2

## 2023-10-19 MED ORDER — POTASSIUM CHLORIDE CRYS ER 20 MEQ PO TBCR
40.0000 meq | EXTENDED_RELEASE_TABLET | Freq: Once | ORAL | Status: AC
  Administered 2023-10-19: 40 meq via ORAL
  Filled 2023-10-19: qty 2

## 2023-10-19 MED ORDER — DICYCLOMINE HCL 20 MG PO TABS: 20.0000 mg | ORAL_TABLET | Freq: Two times a day (BID) | ORAL | 0 refills | Status: AC

## 2023-10-19 MED ORDER — ONDANSETRON 4 MG PO TBDP: 4.0000 mg | ORAL_TABLET | Freq: Three times a day (TID) | ORAL | 0 refills | Status: AC | PRN

## 2023-10-19 MED ORDER — METOCLOPRAMIDE HCL 5 MG/ML IJ SOLN
10.0000 mg | Freq: Once | INTRAMUSCULAR | Status: AC
  Administered 2023-10-19: 10 mg via INTRAVENOUS
  Filled 2023-10-19: qty 2

## 2023-10-19 MED ORDER — FAMOTIDINE 20 MG PO TABS: 20.0000 mg | ORAL_TABLET | Freq: Two times a day (BID) | ORAL | 0 refills | Status: AC

## 2023-10-19 MED ORDER — FAMOTIDINE IN NACL 20-0.9 MG/50ML-% IV SOLN
20.0000 mg | Freq: Once | INTRAVENOUS | Status: AC
  Administered 2023-10-19: 20 mg via INTRAVENOUS
  Filled 2023-10-19: qty 50

## 2023-10-19 MED ORDER — LACTATED RINGERS IV BOLUS
1000.0000 mL | Freq: Once | INTRAVENOUS | Status: AC
  Administered 2023-10-19: 1000 mL via INTRAVENOUS

## 2023-10-19 NOTE — ED Notes (Signed)
 Reduced IVF rate due to positional IV, slower rates ran without setting off pump alarms.

## 2023-10-19 NOTE — ED Notes (Signed)
 Heatpack given for swelling to LAC. It has gone down since initial infiltration.

## 2023-10-19 NOTE — ED Notes (Signed)
 I sat the patient up in her bed to take her PO K. After a few minutes and briefing her on obtaining a UA, I noticed her LAC was swollen. Pt did not have pain until I palpated but the vein had blown with the movement. No phelebitis noted, only LR infusing.

## 2023-10-19 NOTE — Discharge Instructions (Signed)
 Your workup here is reassuringly.  I suspect that your symptoms are viral in nature however please monitor your symptoms and return to the emergency room for any new fever, worsening pain, any other new or concerning symptoms.  Your potassium levels were found to be low which is common with diarrheal illnesses and with vomiting.  Please take the potassium supplement that I provided you until complete.  Use Zofran  every 6 hours as needed for nausea/vomiting.  Follow-up with your primary care doctor next week to have your potassium rechecked

## 2023-10-19 NOTE — ED Provider Notes (Signed)
 [facility] Provider Note   CSN: [id number] Arrival date & time: 10/19/23  0909     Patient presents with: Emesis and Diarrhea   Stacie Young is a 53 y.o. female.    Emesis Associated symptoms: diarrhea   Diarrhea Associated symptoms: vomiting    Patient is a 53 year old female with past medical history significant for reflux, ADHD, PTSD, chronic abdominal pain, anxiety  She presents emergency room today with complaints of some generalized achy crampy abdominal pain associated with nausea, several episodes of nonbloody nonbilious emesis, loose stool.  She denies any recent antibiotic, recent travel, no fevers at home.  No blood in her stool.  Did have small, thin streak of BRB in emesis x 1.  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  She denies any chest pain difficulty breathing.  No history of abdominal surgeries.       Prior to Admission medications   Medication Sig Start Date End Date Taking? Authorizing Provider  dicyclomine  (BENTYL ) 20 MG tablet Take 1 tablet (20 mg total) by mouth 2 (two) times daily. 10/19/23  Yes Sandrine Bloodsworth S, PA  famotidine (PEPCID) 20 MG tablet Take 1 tablet (20 mg total) by mouth 2 (two) times daily. 10/19/23  Yes Malasia Torain S, PA  ondansetron  (ZOFRAN -ODT) 4 MG disintegrating tablet Take 1 tablet (4 mg total) by mouth every 8 (eight) hours as needed for nausea or vomiting. 10/19/23  Yes Rivers Hamrick S, PA  potassium chloride  (KLOR-CON ) 10 MEQ tablet Take 1 tablet (10 mEq total) by mouth daily. 10/19/23  Yes Rogelio Waynick S, PA  albuterol  (PROAIR  HFA) 108 (90 Base) MCG/ACT inhaler Inhale 2 puffs into the lungs every 6 (six) hours as needed for wheezing or shortness of breath. 10/18/16   Krishnan, Gokul, MD  ALPRAZolam  (XANAX ) 1 MG tablet Take 0.5 mg by mouth 3 (three) times daily as needed. For anxiety    [provider]  amphetamine -dextroamphetamine  (ADDERALL) 10 MG tablet Take 10 mg by mouth daily with breakfast.    [provider]  FLUoxetine  (PROZAC ) 10 MG tablet Take 10 mg by mouth daily.    [provider]  guaiFENesin  (MUCINEX ) 600 MG 12 hr tablet Take 1 tablet (600 mg total) by mouth 2 (two) times daily. 10/18/16   Krishnan, Gokul, MD  methocarbamol  (ROBAXIN ) 750 MG tablet Take 1 tablet (750 mg total) by mouth 3 (three) times daily as needed (muscle spasm/pain). 04/20/21   Steinl, Kevin, MD  metoCLOPramide  (REGLAN ) 10 MG tablet Take 1 tablet (10 mg total) by mouth every 6 (six) hours as needed for nausea (nausea/headache). 11/22/22   Lenor Hollering, MD  Multiple Vitamin (MULTIVITAMIN WITH MINERALS) TABS tablet Take 1 tablet by mouth daily. 10/18/16   Krishnan, Gokul, MD  nicotine  (NICODERM CQ  - DOSED IN MG/24 HOURS) 21 mg/24hr patch Place 1 patch (21 mg total) onto the skin daily. 10/18/16   Krishnan, Gokul, MD  pantoprazole  (PROTONIX ) 40 MG tablet Take 40 mg by mouth daily.    [provider]  temazepam  (RESTORIL ) 30 MG capsule Take 1 at night for sleep 01/06/15   [provider]  thiamine  100 MG tablet Take 1 tablet (100 mg total) by mouth daily. 10/18/16   Krishnan, Gokul, MD    Allergies: Glucosamine, Iodine, and Shellfish-derived products    Review of Systems  Gastrointestinal:  Positive for diarrhea and vomiting.  Updated Vital Signs BP (!) 144/85   Pulse 61   Temp 98.6 F (37 C) (Oral)   Resp 17   Wt 65.8 kg   LMP 04/19/2011   SpO2 93%   BMI 28.32 kg/m   Physical Exam Vitals and nursing note reviewed.  Constitutional:      General: She is not in acute distress. HENT:     Head: Normocephalic and atraumatic.     Nose: Nose normal.     Mouth/Throat:     Mouth: Mucous membranes are dry.  Eyes:     General: No scleral icterus. Cardiovascular:     Rate and Rhythm: Normal rate and regular rhythm.     Pulses: Normal pulses.     Heart sounds:  Normal heart sounds.  Pulmonary:     Effort: Pulmonary effort is normal. No respiratory distress.     Breath sounds: No wheezing.  Abdominal:     Palpations: Abdomen is soft.     Tenderness: There is no abdominal tenderness. There is no guarding or rebound.  Musculoskeletal:     Cervical back: Normal range of motion.     Right lower leg: No edema.     Left lower leg: No edema.  Skin:    General: Skin is warm and dry.     Capillary Refill: Capillary refill takes less than 2 seconds.  Neurological:     Mental Status: She is alert. Mental status is at baseline.  Psychiatric:        Mood and Affect: Mood normal.        Behavior: Behavior normal.     (all labs ordered are listed, but only abnormal results are displayed) Labs Reviewed  COMPREHENSIVE METABOLIC PANEL WITH GFR - Abnormal; Notable for the following components:      Result Value   Potassium 3.0 (*)    Chloride 94 (*)    Glucose, Bld 116 (*)    Anion gap 19 (*)    All other components within normal limits  CBC - Abnormal; Notable for the following components:   WBC 13.1 (*)    RBC 5.43 (*)    Hemoglobin 15.6 (*)    All other components within normal limits  URINALYSIS, ROUTINE W REFLEX MICROSCOPIC - Abnormal; Notable for the following components:   APPearance HAZY (*)    Bilirubin Urine SMALL (*)    Ketones, ur >=80 (*)    Protein, ur 30 (*)    All other components within normal limits  BASIC METABOLIC PANEL WITH GFR - Abnormal; Notable for the following components:   Potassium 3.1 (*)    Chloride 95 (*)    Glucose, Bld 104 (*)    Anion gap 16 (*)    All other components within normal limits  URINALYSIS, MICROSCOPIC (REFLEX) - Abnormal; Notable for the following components:   Bacteria, UA FEW (*)    All other components within normal limits  LIPASE, BLOOD    EKG: None  Radiology: No results found.   Procedures   Medications Ordered in the ED  ondansetron  (ZOFRAN ) injection 4 mg (4 mg Intravenous  Given 10/19/23 1012)  famotidine (PEPCID) IVPB 20 mg premix (0 mg Intravenous Stopped 10/19/23 1057)  lactated ringers bolus 1,000 mL (0 mLs Intravenous Stopped 10/19/23 1255)  metoCLOPramide  (REGLAN ) injection 10 mg (10 mg Intravenous Given 10/19/23 1053)  diphenhydrAMINE (BENADRYL) capsule 25 mg (25 mg Oral Given 10/19/23 1054)  potassium chloride  SA (KLOR-CON  M) CR tablet 40 mEq (40 mEq Oral  Given 10/19/23 1248)                                    Medical Decision Making Amount and/or Complexity of Data Reviewed Labs: ordered.  Risk Prescription drug management.   Patient is a 53 year old female with past medical history significant for reflux, ADHD, PTSD, chronic abdominal pain, anxiety  She presents emergency room today with complaints of some generalized achy crampy abdominal pain associated with nausea, several episodes of nonbloody nonbilious emesis, loose stool.  She denies any recent antibiotic, recent travel, no fevers at home.  No blood in her stool.  Did have small, thin streak of BRB in emesis x 1.  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  She denies any chest pain difficulty breathing.  No history of abdominal surgeries.   No focal abdominal tenderness.  No guarding or rebound.  Or mucosas significantly dry.  Will hydrate with IV fluids, provide antiemetics and reevaluate.  CMP with hypokalemia and anion gap likely secondary to dehydration.  Patient has received 800 mL of lactated Ringer's p.o. potassium Pepcid, Benadryl, Reglan  and Zofran  she is tolerating p.o.  Repeat BMP with improvement in anion gap, patient is tolerating p.o.  Some ketones in urine however normal bicarb and I suspect this is from anorexia.  Mild leukocytosis and erythrocytosis on CBC likely from dehydration.  Will discharge home at this time.  Return precautions discussed.  She will follow-up with primary care provider next  week however if she develops any worsening abdominal pain, fevers, any other new or concerning symptoms will return to the emergency room expediently.   Final diagnoses:  Nausea vomiting and diarrhea  Hypokalemia    ED Discharge Orders          Ordered    ondansetron  (ZOFRAN -ODT) 4 MG disintegrating tablet  Every 8 hours PRN        10/19/23 1400    dicyclomine  (BENTYL ) 20 MG tablet  2 times daily        10/19/23 1400    potassium chloride  (KLOR-CON ) 10 MEQ tablet  Daily        10/19/23 1400    famotidine (PEPCID) 20 MG tablet  2 times daily        10/19/23 1400               Neldon Hamp RAMAN, [place] 10/19/23 1417    Bari Roxie HERO, DO 10/20/23 1412

## 2023-10-19 NOTE — ED Notes (Signed)
 Pt endorsed feeling ill since Saturday. The pt went to an AA meeting and thinks she caught a bug. No extensive abd surgeries, CAOx4. Back and flank pain with diffuse abd pain. Endorsed consuming CBD gummies about 10 days prior, no constant abuse noted. Mostly bile with bright red streaking from vomit, no blood or black stools noted. Hx of loose stools at baseline, but diarrhea is worse with n/v upset since Saturday. OTC meds did not help.

## 2023-10-19 NOTE — ED Notes (Signed)

## 2023-10-19 NOTE — ED Triage Notes (Signed)
 Pt arrived POV. Reports Nausea began Saturday, followed by vomiting and diarrhea. Constant nausea Last vomited while in room. Water coming up Last diarrhea this morning. Denies blood in stool. Generalized abdominal pain
